# Patient Record
Sex: Female | Born: 1985 | Race: White | Hispanic: No | Marital: Married | Smoking: Current every day smoker
Health system: Southern US, Community
[De-identification: ages and names within clinical notes are randomized; demographics above are authoritative.]

## PROBLEM LIST (undated history)

## (undated) DIAGNOSIS — A630 Anogenital (venereal) warts: Secondary | ICD-10-CM

## (undated) DIAGNOSIS — N840 Polyp of corpus uteri: Secondary | ICD-10-CM

## (undated) DIAGNOSIS — F419 Anxiety disorder, unspecified: Secondary | ICD-10-CM

## (undated) DIAGNOSIS — IMO0002 Reserved for concepts with insufficient information to code with codable children: Secondary | ICD-10-CM

## (undated) DIAGNOSIS — N871 Moderate cervical dysplasia: Secondary | ICD-10-CM

## (undated) HISTORY — PX: COLPOSCOPY: SHX161

## (undated) HISTORY — DX: Moderate cervical dysplasia: N87.1

## (undated) HISTORY — DX: Anogenital (venereal) warts: A63.0

## (undated) HISTORY — DX: Anxiety disorder, unspecified: F41.9

## (undated) HISTORY — DX: Reserved for concepts with insufficient information to code with codable children: IMO0002

---
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
## 2004-01-28 ENCOUNTER — Emergency Department (HOSPITAL_COMMUNITY): Admission: EM | Admit: 2004-01-28 | Discharge: 2004-01-28 | Payer: Self-pay | Admitting: Emergency Medicine

## 2004-06-01 ENCOUNTER — Emergency Department (HOSPITAL_COMMUNITY): Admission: EM | Admit: 2004-06-01 | Discharge: 2004-06-01 | Payer: Self-pay | Admitting: Emergency Medicine

## 2004-12-07 ENCOUNTER — Emergency Department (HOSPITAL_COMMUNITY): Admission: EM | Admit: 2004-12-07 | Discharge: 2004-12-08 | Payer: Self-pay | Admitting: Emergency Medicine

## 2005-04-26 ENCOUNTER — Encounter: Admission: RE | Admit: 2005-04-26 | Discharge: 2005-04-26 | Payer: Self-pay | Admitting: Family Medicine

## 2005-05-18 ENCOUNTER — Emergency Department (HOSPITAL_COMMUNITY): Admission: EM | Admit: 2005-05-18 | Discharge: 2005-05-18 | Payer: Self-pay | Admitting: Emergency Medicine

## 2006-03-12 ENCOUNTER — Other Ambulatory Visit: Admission: RE | Admit: 2006-03-12 | Discharge: 2006-03-12 | Payer: Self-pay | Admitting: Gynecology

## 2006-03-22 DIAGNOSIS — N871 Moderate cervical dysplasia: Secondary | ICD-10-CM

## 2006-03-22 HISTORY — DX: Moderate cervical dysplasia: N87.1

## 2007-01-19 ENCOUNTER — Inpatient Hospital Stay (HOSPITAL_COMMUNITY): Admission: RE | Admit: 2007-01-19 | Discharge: 2007-01-22 | Payer: Self-pay | Admitting: Obstetrics & Gynecology

## 2008-02-18 ENCOUNTER — Ambulatory Visit: Payer: Self-pay | Admitting: Gynecology

## 2008-04-06 ENCOUNTER — Ambulatory Visit: Payer: Self-pay | Admitting: Gynecology

## 2008-04-06 ENCOUNTER — Encounter: Payer: Self-pay | Admitting: Gynecology

## 2008-04-06 ENCOUNTER — Other Ambulatory Visit: Admission: RE | Admit: 2008-04-06 | Discharge: 2008-04-06 | Payer: Self-pay | Admitting: Gynecology

## 2008-07-30 ENCOUNTER — Emergency Department (HOSPITAL_COMMUNITY): Admission: EM | Admit: 2008-07-30 | Discharge: 2008-07-30 | Payer: Self-pay | Admitting: Emergency Medicine

## 2009-02-22 ENCOUNTER — Emergency Department (HOSPITAL_COMMUNITY): Admission: EM | Admit: 2009-02-22 | Discharge: 2009-02-22 | Payer: Self-pay | Admitting: Emergency Medicine

## 2009-03-31 ENCOUNTER — Ambulatory Visit (HOSPITAL_COMMUNITY): Admission: RE | Admit: 2009-03-31 | Discharge: 2009-03-31 | Payer: Self-pay | Admitting: Family Medicine

## 2009-04-21 ENCOUNTER — Ambulatory Visit (HOSPITAL_COMMUNITY)
Admission: RE | Admit: 2009-04-21 | Discharge: 2009-04-21 | Payer: Self-pay | Source: Home / Self Care | Admitting: Family Medicine

## 2009-10-02 ENCOUNTER — Encounter: Admission: RE | Admit: 2009-10-02 | Discharge: 2009-10-02 | Payer: Self-pay | Admitting: Family Medicine

## 2010-06-05 NOTE — Op Note (Signed)
Jacqueline Stewart, VOKES          ACCOUNT NO.:  [id number]   MEDICAL RECORD NO.:  [id number]          PATIENT TYPE:  INP   LOCATION:  9199                          FACILITY:  [facility]   PHYSICIAN:  Gerrit Friends. Aldona Bar, M.D.   DATE OF BIRTH:  1985-08-14   DATE OF PROCEDURE:  01/19/2007  DATE OF DISCHARGE:                               OPERATIVE REPORT   PREOPERATIVE DIAGNOSES:  Term pregnancy, previous cesarean section.   POSTOPERATIVE DIAGNOSIS:  Term pregnancy, previous cesarean section,  plus delivery of 7 pounds 7 ounces female infant, Apgars 9 and 10.   PROCEDURE:  Repeat low transverse cesarean section.   SURGEON:  Gerrit Friends. Aldona Bar, M.D.   ASSISTANT:  Randye Lobo, M.D.   Spinal, Raul Del, M.D.   HISTORY:  This 25 year old gravida 2 para 1 had a relatively benign  pregnancy.  Due date January 25, 2007.  She is now at 75 weeks' gestation  and being taken to the operating room for a repeat low transverse  cesarean section, having had a cesarean section delivery with her first  baby.   PROCEDURE:  The patient was taken to the operating room where after  satisfactory induction of spinal anesthetic by Dr. Tacy Dura, she was  prepped and draped, having been placed in the supine position slightly  tilted to the left with a Foley catheter inserted as part of the prep.   Once the patient was adequately draped and good anesthetic levels were  documented, the procedure was begun.   A Pfannenstiel incision was made and with minimal difficulty dissected  down sharply to and through the fascia in a low transverse fashion.  Subfascial space was created inferiorly and superiorly, muscles  separated in the midline, peritoneum identified and entered  appropriately with care taken to avoid the bowel superiorly and the  bladder inferiorly.  There was are area of adhesion on the right side of  the uterus and also a small loop of omentum in this adhesion.  Nonetheless, it was possible to obtain  adequate exposure, and once the  bladder blade was place, the vesicouterine peritoneum was incised in a  low transverse fashion and pushed off the lower uterine segment with  ease.  Sharp incision into the uterus was then made with the Metzenbaum  scissors, extended laterally with fingers.  Fluid was clear and with the  aid of vacuum extractor a viable female infant weighing 7 pounds 7 ounces  delivered from a vertex position.  After the cord was clamped and cut,  the infant was passed off to the awaiting team.  Subsequent Apgars were  assigned at 9 and 10.   Cord bloods were collected and thereafter the placenta was delivered  intact.   At this time the placenta was extracted.  Uterus was exteriorized and  manually rendered free of any remaining products of conception.  Good  uterine contractility was afforded with slowly given intravenous Pitocin  and manual stimulation.  The adhesion on the right side of the uterus  which was originally felt to be the round ligament was clamped, cut and  rendered hemostatic, and the  band of omentum was freed.  At this time  closure of the uterine incision was carried out using #1 Vicryl from  angle to angle bilaterally with relatively good hemostasis.  Several  additional figure-of-eight #1 Vicryl sutures were applied for additional  hemostasis and support of the incision.   At this time the incision was noted to be dry.  The area of adhesiolysis  was stable, tubes and ovaries normal, uterus well contracted, and after  the abdomen was lavaged of all free blood and clot, the uterus was  replaced in the abdominal cavity and after all counts noted to be  correct and no foreign bodies in the remaining abdominal cavity, closure  of the abdomen was begun in layers.  The abdominal peritoneum was closed  with 0 Vicryl in a running fashion.  Muscle secured with same.  Assured  of good fascial hemostasis, the fascia was then reapproximated using 0  Vicryl from  angle to midline bilaterally.  Subcutaneous tissue was  rendered hemostatic.  The original skin incision was undermined some to  facilitate good closure of the skin which was closed with staples  thereafter.  A sterile pressure dressing was applied and the patient at  this time was transported to the recovery room in satisfactory  condition, having tolerated the procedure well..  Estimated blood loss  500 mL.  All counts were correct x2.  At the conclusion of this  procedure, both mother and baby were doing well respective in their  respective recovery areas.      Gerrit Friends. Aldona Bar, M.D.  Electronically Signed     RMW/MEDQ  D:  01/19/2007  T:  01/19/2007  Job:  [id number]

## 2010-06-08 NOTE — Discharge Summary (Signed)
NAME:  Jacqueline Stewart, POLIMENI NO.:  [id number]   MEDICAL RECORD NO.:  [id number]            PATIENT TYPE:   LOCATION:                                 FACILITY:   PHYSICIAN:  Randye Lobo, M.D.        DATE OF BIRTH:   DATE OF ADMISSION:  01/19/2007  DATE OF DISCHARGE:  01/22/2007                               DISCHARGE SUMMARY   FINAL DIAGNOSES:  Intrauterine pregnancy at term, history of previous  cesarean section, and the patient desires repeat cesarean section.   PROCEDURE:  Repeat low transverse cesarean section.  Surgeon, Annamaria Helling, M.D., assistant Dr. Conley Simmonds.   COMPLICATIONS:  None.   This 25 year old G2, P1 was admitted at term for repeat cesarean  section.  She had had a prior cesarean section with her first delivery  and desires a repeat with this pregnancy as well.  Her antepartum course  up to this point has been uncomplicated.  She was taken to the operating  room on January 19, 2007 by Dr. Annamaria Helling where a repeat low  transverse cesarean section was performed with the delivery of a 7  pounds 7 ounces female infant with Apgars of 9 and 10.  Delivery went  without complications.  The patient's postoperative course was benign  without any significant fevers.  The little boy was circumcised prior to  discharge.  The patient was felt ready for discharge on postoperative  day #3, was sent home on a regular diet, told to decrease activities,  told to continue prenatal vitamins, was given Percocet 1-2 every 4-6  hours as needed for pain, told she could use ibuprofen up to 600 mg  every 6 hours as needed for pain, was to follow up in our office in 4  weeks.  Instructions and precautions were reviewed with the patient.   LABORATORIES ON DISCHARGE:  She had a hemoglobin of 10.3, white blood  cell count of 9.4 and platelets of 180,000.      Leilani Able, P.A.-C.      Randye Lobo, M.D.  Electronically Signed    MB/MEDQ  D:  02/17/2007   T:  02/17/2007  Job:  [id number]

## 2010-06-08 NOTE — H&P (Signed)
[place]  Patient:    Jacqueline, Stewart Visit Number: [id number] MRN: [id number]          Service Type: PED Location: PEDS 6122 01 Attending Physician:  Rosanne Sack Dictated by:   Rosanne Sack, M.D. Admit Date:  01/26/2001   CC:         Dyanne Carrel, M.D. - [facility]   History and Physical  DATE OF BIRTH:  [birth year]  PROBLEMS 1. Sinus symptom complex:  Subjective fever, chills, dysuria, increased    urinary frequency, left flank pain, nausea, and vomiting.    a. Rule out acute pyelonephritis with septicemia.    b. Rule out pregnancy. 2. Dehydration. 3. Tobacco abuse, one pack per day x 2 years. 4. History of urinary tract infection in November 2002. 5. History of vaginal Candidiasis on December 31, 2000, treated with Diflucan.  CHIEF COMPLAINT:  Nausea, vomiting, and left flank pain.  HISTORY OF PRESENT ILLNESS:  [name] is a very pleasant 25 year old female, who presents with a six-day history of increased urinary frequency, dysuria, and lower pelvic pressure.  For the last three days, she also has noticed left flank tenderness, a subjective fever, and chills.  This morning she woke up having nausea and vomiting.  No diarrhea.  No vaginal discharge.  The patients last menstrual period was on December 31, 2000.  She was taking birth control pills until 1-1/2 months ago.  She is sexually active.  She has one single sexual partner.  She has been using condoms on every single sexual relation.  No hematuria.  No headaches.  No syncopal episodes.  No diarrhea. No melena, tarry stools, or bright red blood per rectum.  No cough.  No shortness of breath.  No chest pain.  The patient describes some lower abdominal pressure, but no cramping symptoms.  No vaginal bleeding.  No skin rash.  PAST MEDICAL HISTORY:  As per the problem list.  ALLERGIES:  No known drug allergies.  CURRENT  MEDICATIONS:  None.  SOCIAL HISTORY:  The patient is single.  Lives with her mother.  She smokes as described above.  She is a [place].  She has a formal boyfriend. As described in the HPI section, she is sexually active and she uses condoms for the last 1-1/2 months.  Prior to this, she was using birth control pills. No children.  She smokes once in awhile, along with some alcohol.  FAMILY HISTORY:  The patients grandmother and grandfather had diabetes mellitus.  Both grandfathers had acute myocardial infarctions in their 53s. No strokes or malignancy in the family.  Her grandmother and great-grandmother had hypertension.  REVIEW OF SYSTEMS:  As in the HPI section.  PHYSICAL EXAMINATION  VITAL SIGNS:  Temperature in the [facility] 99.7 degrees, heart rate 102, respirations 20, blood pressure 116/65.  HEENT:  Normocephalic, atraumatic.  Anicteric sclerae.  Conjunctivae within normal limits.  PERRLA.  EOMI.  Funduscopic exam negative for papilledema or hemorrhages.  Dry mucous membranes.  Tympanic membranes within normal limits. Oropharynx clear.  NECK:  Supple, no jugular venous distention, no bruits, no adenopathy, no thyromegaly.  LUNGS:  Clear to auscultation bilaterally without crackles or wheezes.  Fair air movement bilaterally.  CARDIAC:  A regular rate and rhythm without murmurs, rubs, or gallops.  Normal S1, S2.  ABDOMEN:  Flat, nontender, nondistended.  Bowel sounds present.  There is a left flank CVA tenderness.  No masses.  No  hepatosplenomegaly.  No bruits, no masses, no guarding.  GENITOURINARY:  Within normal limits.  BREASTS:  Within normal limits.  RECTAL:  Deferred.  EXTREMITIES:  No edema, no cyanosis.  Pulses 2+ bilaterally.  NEUROLOGIC:  Alert and oriented x 3.  Cranial nerves II-XII intact.  Strength 5/5 in all extremities.  Deep tendon reflexes 3/5 in all extremities.  Sensory intact.  Plantar reflexes downgoing  bilaterally.  LABORATORY DATA:  Pending.  ASSESSMENT/PLAN 1. Probable acute pyelonephritis with septicemia:  Given the onset of    urinary symptoms about six days ago, followed by progression of her    lower pelvic pain that extended into the flank, and then fever and    chills, pyelonephritis is very likely.  The patient was not able to    hold fluids or solids for the last 12 hours.  For this reason she is    being admitted to receive intravenous antibiotic therapy and supportive    care.  Given that her last menstrual period was on December 31, 2000,    and the patient is sexually active, a urinary pregnancy test will be    obtained.  A urine culture also will be obtained prior to the initiation    of intravenous antibiotic therapy.  I will hold on the antibiotics until    the results of the urinary pregnancy test are available, and with    negative results.  Will provide with supportive care with intravenous    Phenergan, Protonix, and morphine for pain control.  No blood cultures    will be obtained, as the patient is hemodynamically stable. 2. Dehydration:  The physical examination is consistent with dehydration.    Will start intravenous normal saline at 100 cc an hour.  Will follow up    the patients electrolytes.  Will supplement with potassium as needed.    Also will follow this problem clinically. 3. Tobacco abuse:  I spent about 15 minutes talking about smoking cessation.    The patient has been smoking for the last two years, a pack a day.  I    stressed the importance of smoking cessation.  The patient declined a    Nicotine Patch to help with withdrawal symptoms. 4. General assessment:  I spoke to the patient about sexual abstinence,    as well as protective sex. Dictated by:   Rosanne Sack, M.D. Attending Physician:  Rosanne Sack DD:  01/26/01 TD:  01/26/01 Job: [id number]

## 2010-06-08 NOTE — Discharge Summary (Signed)
NAME:  Jacqueline Stewart, Jacqueline Stewart                        ACCOUNT NO.:  [id number]   MEDICAL RECORD NO.:  [id number]                   PATIENT TYPE:  INP   LOCATION:  9132                                 FACILITY:  [facility]   PHYSICIAN:  Ivor Costa. Farrel Gobble, M.D.              DATE OF BIRTH:  11/16/1985   DATE OF ADMISSION:  07/18/2003  DATE OF DISCHARGE:                                 DISCHARGE SUMMARY   PRINCIPAL COMPLAINT:  Term pregnancy.   PRINCIPAL PROCEDURE:  Primary cesarean section.   HISTORY OF PRESENT ILLNESS:  The patient is an 25 year old G1 at 40+ weeks  who presented to labor and delivery in active labor on the morning of July 18, 2003.  The patient rapidly progressed to 6 cm; however, arrested at that  point for approximately 6 hours.  An IUPC had been placed, Pitocin  augmentation had been begun, and despite what were felt to be adequate  contractions and usual maneuvers in order to turn the baby, the patient  failed to make any further change in dilation or descent and therefore  underwent a primary cesarean section for suspected CPD on the evening of  June 27.  The C-section was uneventful, estimated blood loss was 600.  It  was delivery of a viable female, 6 pounds, with Apgars of 8 and 9 and  spontaneous cry.  The patient was then transferred to the postpartum floor.  Her postpartum course was unremarkable.  At the time of discharge the  patient was breastfeeding, she had received a breastfeeding consultation.  She was ambulating without difficulty, she was controlled with her oral pain  medicines of Motrin and Tylox, and she was discharged after her staples were  removed.   LABORATORY DATA:  Her postpartum white count was 12, hemoglobin was 11.6,  hematocrit was 33.7, and platelets were 180.   DISCHARGE CONDITION:  Stable.   She was instructed to follow up in the office 6 weeks postpartum.                                               Ivor Costa. Farrel Gobble, M.D.    Jacqueline Stewart   D:  07/21/2003  T:  07/21/2003  Job:  [id number]

## 2010-06-08 NOTE — Discharge Summary (Signed)
[place]. [facility]  Patient:    Jacqueline Stewart, Jacqueline Stewart Visit Number: [id number] MRN: [id number]          Service Type: PED Location: PEDS 6122 01 Attending Physician:  Rosanne Sack Dictated by:   Renford Dills, M.D. Admit Date:  01/26/2001 Discharge Date: 02/01/2001   CC:         Jacqueline Stewart, M.D., [facility]   Discharge Summary  DISCHARGE DIAGNOSES: 1. Pyelonephritis. 2. Presumed pelvic inflammatory disease (PID). 3. Dehydration. 4. Hypokalemia, resolved. 5. Bilateral ovarian cysts.  DISCHARGE MEDICATIONS: 1. Tequin 400 mg 1 daily. 2. Flagyl 500 mg 1 every 12 hours. 3. Indocid 1 every 8-12 hours only as needed for severe pain. 4. Motrin 800 mg 1 every 12 hours.  FOLLOWUP:  The patient will be asked to follow up with her primary care physician, Dr. Gaylan Stewart, in approximately one week for followup exam and followup blood work to check renal function and CBC.  The patient is asked to drink at least 1-1/2 liters of water per day and to refrain from any alcohol use while on above antibiotics.  STUDIES:  The patient had a UA C&S which grew out Jacqueline Stewart greater than 100,000 colonies, pan sensitive.  The patient had a CT scan of the abdomen and pelvis which was significant for no ureteral stone, no renal stone.  It did show enlargement, mildly, of the left kidney.  It also showed bilateral ovarian cysts and fluid in the pelvis.  It also shows some mild edema associated with inferior left perirenal tissue, extending to the region of the left pericolic gutter, and as stated, no stone and definitely no hydronephrosis associated with either kidney.  LABORATORIES:  The patient had a CBC and BMET ordered serially.  Admission white count 25,000, discharge white count 7.  The patient intermittently had hypokalemia during this admission, no complications, resolved with repletion with a discharge potassium of 3.8.  Please see  admission H&P for further details on history of present illness.  HOSPITAL COURSE: #1 - PYELONEPHRITIS/PRESUMED PID:  This is a 25 year old white female who was admitted to the medicine service after having a six-day history of increased urinary frequency, dysuria, and pelvic pressure, as well as left flank tenderness, subjective fever and chills.  The morning of admission, the patient had awakened with nausea and vomiting.  She denied any vaginal discharge.  The patient was seen by her primary medical doctor and sent to the hospital for evaluation.  Laboratories, as described, revealed elevated white blood cell count, fever, and abnormal UA.  Admitting diagnosis was pyelonephritis.  The patient was started on empiric antibiotics in the form of Tequin.  The patients course was somewhat tenuous with continued fever and significant abdominal discomfort which the patient described as being colicky. This symptoms complex was followed up by CT scan.  CT scan did not show any stone, however, findings as described previously, left kidney somewhat larger than the right, associated edema, bilateral ovarian cysts, and fluid in the pelvis.  The differential diagnoses included fluid from the ruptured cyst versus PID.  Because of the patients history of STDs, i.e., yeast infection, and being sexually active, and with pelvic pressure, it was elected to treat the patient empirically with PID as well, therefore, Flagyl was added.  Over the next 24-48 hours, the patients course improved dramatically with defervescence of the fever, decrease in symptoms of pain, resolution of elevated white blood cell count.  The patient was continued on the  current treatment until discharge.  At the time of discharge, the patient is afebrile with normal white count and normal renal function.  The patient is being discharged to home.  She will continue a total of two weeks off of antibiotics in the form of Flagyl  and Tequin.  She was asked to follow up with her outpatient primary care physician in approximately one week for followup laboratories.  #2 - HYPOKALEMIA:  Resolved, post repletion.  #3 - DEHYDRATION:  Resolved, post receiving IV fluids.  #4 - TOBACCO ABUSE:  The patient is asked to refrain from tobacco use. Dictated by:   Renford Dills, M.D. Attending Physician:  Rosanne Sack DD:  02/01/01 TD:  02/02/01 Job: [id number]

## 2010-06-08 NOTE — Op Note (Signed)
NAME:  Jacqueline Stewart, Jacqueline Stewart                        ACCOUNT NO.:  [id number]   MEDICAL RECORD NO.:  [id number]                   PATIENT TYPE:  INP   LOCATION:  9132                                 FACILITY:  [facility]   PHYSICIAN:  Ivor Costa. Farrel Gobble, M.D.              DATE OF BIRTH:  Jan 10, 1986   DATE OF PROCEDURE:  07/18/2003  DATE OF DISCHARGE:                                 OPERATIVE REPORT   PREOPERATIVE DIAGNOSES:  1. Forty-week gestation.  2. Cephalopelvic disproportion.   POSTOPERATIVE DIAGNOSES:  1. Forty-week gestation.  2. Cephalopelvic disproportion.   PROCEDURE:  Primary cesarean section, low flap transverse.   SURGEON:  Ivor Costa. Farrel Gobble, M.D.   ANESTHESIA:  Spinal.   ESTIMATED BLOOD LOSS:  600 mL.   URINE OUTPUT:  Greater than 75 mL of clear urine.   FINDINGS:  A viable female infant in OP presentation, loose nuchal cord.  Clear amniotic fluid.  Birth weight was 6 pounds, Apgars 8 and 9, normal  uterus, tubes, and ovaries.   COMPLICATIONS:  None.   PATHOLOGY:  None.   PROCEDURE:  The patient was taken to the operating room, spinal anesthesia  was induced, and placed in a supine position with left lateral displacement,  prepped and draped in the usual sterile fashion.  After adequate anesthesia  was assured, a Pfannenstiel skin incision was made with a scalpel and  carried through to the underlying layer of fascia with electrocautery.  The  fascia was scored in the midline.  The incision was extended laterally with  the Bovie.  The inferior aspect of the fascial incision was grasped with  Kochers.  Underlying rectus muscles were dissected off by blunt and sharp  dissection.  In a similar fashion the inferior aspect of the incision was  grasped with Kochers and the underlying rectus muscles were dissected off.  The rectus muscles were separated in the midline.  The peritoneum was  entered bluntly.  The peritoneal incision was then extended superiorly and  inferiorly  with good visualization of the underlying bowel and bladder.  The  __________ used to confirm the bladder blade was inserted.  The  vesicouterine peritoneum was identified and entered sharply with the  Metzenbaums.  The incision was extended laterally.  The bladder flap was  created digitally.  The bladder blade was then reinserted and the lower  uterine segment was incised in transverse fashion with the scalpel.  Once we  entered the cavity, it was clear that the infant was in the OP presentation  as he bit the examining finger placed in the incision, which was extended  bluntly.  The infant was then delivered from the vertex presentation.  The  cord was reduced and the cord was clamped and cut, and then handed off to  the waiting pediatricians.  Cord bloods were obtained as well as cord blood  for paternity testing.  The uterus was massaged.  The placenta was allowed  to separate naturally.  The uterus was then cleared of all clots and debris.  The uterine incision was repaired with a running locked layer of 0 chromic  and a small area of bleeding inferior to the incision was treated with a  figure-of-eight and noted to be hemostatic.  The pelvis was then irrigated  with copious amounts of warm saline.  The uterus, tubes, and ovaries were  inspected and noted to be unremarkable.  The peritoneum, bladder flap, and  muscle were also noted to be hemostatic.  The fascia was then closed with 0  Vicryl in a running fashion, the subcu was irrigated, the subcu was  reapproximated with interrupted 2-0 plain, and the skin was reapproximated  with staples.  The patient tolerated the procedure well.  Sponge, lap, and  needle counts were correct x2 and she was transferred to the PACU in stable  condition.                                               Ivor Costa. Farrel Gobble, M.D.    Leda Roys  D:  07/18/2003  T:  07/19/2003  Job:  [id number]

## 2010-10-26 LAB — CBC
HCT: 38.9
Hemoglobin: 10.3 — ABNORMAL LOW
MCHC: 34.7
MCHC: 35
RBC: 4.14
RDW: 13.3
WBC: 8.9

## 2010-10-26 LAB — RPR: RPR Ser Ql: NONREACTIVE

## 2011-01-22 DIAGNOSIS — IMO0002 Reserved for concepts with insufficient information to code with codable children: Secondary | ICD-10-CM

## 2011-01-22 DIAGNOSIS — A630 Anogenital (venereal) warts: Secondary | ICD-10-CM

## 2011-01-22 HISTORY — DX: Anogenital (venereal) warts: A63.0

## 2011-01-22 HISTORY — DX: Reserved for concepts with insufficient information to code with codable children: IMO0002

## 2011-01-31 ENCOUNTER — Encounter (HOSPITAL_COMMUNITY): Payer: Self-pay

## 2011-01-31 ENCOUNTER — Emergency Department (HOSPITAL_COMMUNITY): Payer: BC Managed Care – PPO

## 2011-01-31 ENCOUNTER — Emergency Department (HOSPITAL_COMMUNITY)
Admission: EM | Admit: 2011-01-31 | Discharge: 2011-01-31 | Disposition: A | Discharge status: Home / Self Care | Payer: BC Managed Care – PPO | Attending: Emergency Medicine | Admitting: Emergency Medicine

## 2011-01-31 DIAGNOSIS — M549 Dorsalgia, unspecified: Secondary | ICD-10-CM | POA: Insufficient documentation

## 2011-01-31 DIAGNOSIS — R110 Nausea: Secondary | ICD-10-CM | POA: Insufficient documentation

## 2011-01-31 DIAGNOSIS — R109 Unspecified abdominal pain: Secondary | ICD-10-CM | POA: Insufficient documentation

## 2011-01-31 LAB — COMPREHENSIVE METABOLIC PANEL
Chloride: 109 mEq/L (ref 96–112)
Creatinine, Ser: 0.87 mg/dL (ref 0.50–1.10)
GFR calc Af Amer: >90 mL/min (ref >90)
GFR calc non Af Amer: >90 mL/min (ref >90)
Glucose, Bld: 76 mg/dL (ref 70–99)
Potassium: 3.8 mEq/L (ref 3.5–5.1)
Sodium: 139 mEq/L (ref 135–145)
Total Bilirubin: 0.2 mg/dL — ABNORMAL LOW (ref 0.3–1.2)
Total Protein: 7.7 g/dL (ref 6.0–8.3)

## 2011-01-31 LAB — URINALYSIS, ROUTINE W REFLEX MICROSCOPIC
Nitrite: NEGATIVE
Protein, ur: NEGATIVE mg/dL
Urobilinogen, UA: 0.2 mg/dL (ref 0.0–1.0)

## 2011-01-31 LAB — WET PREP, GENITAL
Trich, Wet Prep: NONE SEEN
Yeast Wet Prep HPF POC: NONE SEEN

## 2011-01-31 LAB — CBC
Hemoglobin: 14.3 g/dL (ref 12.0–15.0)
MCH: 31 pg (ref 26.0–34.0)
WBC: 11.6 10*3/uL — ABNORMAL HIGH (ref 4.0–10.5)

## 2011-01-31 LAB — POCT PREGNANCY, URINE: Preg Test, Ur: NEGATIVE

## 2011-01-31 LAB — LIPASE, BLOOD: Lipase: 19 U/L (ref 11–59)

## 2011-01-31 MED ORDER — SODIUM CHLORIDE 0.9 % IV SOLN: INTRAVENOUS | Status: DC

## 2011-01-31 MED ORDER — CEFTRIAXONE SODIUM 250 MG IJ SOLR
250.0000 mg | Freq: Once | INTRAMUSCULAR | Status: AC
  Administered 2011-01-31: 250 mg via INTRAMUSCULAR
  Filled 2011-01-31: qty 250

## 2011-01-31 MED ORDER — IOHEXOL 300 MG/ML  SOLN
100.0000 mL | Freq: Once | INTRAMUSCULAR | Status: AC | PRN
  Administered 2011-01-31: 100 mL via INTRAVENOUS

## 2011-01-31 MED ORDER — HYDROMORPHONE HCL PF 1 MG/ML IJ SOLN
1.0000 mg | Freq: Once | INTRAMUSCULAR | Status: AC
  Administered 2011-01-31: 1 mg via INTRAVENOUS
  Filled 2011-01-31: qty 1

## 2011-01-31 MED ORDER — ONDANSETRON 8 MG PO TBDP: 8.0000 mg | ORAL_TABLET | Freq: Three times a day (TID) | ORAL | Status: AC | PRN

## 2011-01-31 MED ORDER — AZITHROMYCIN 250 MG PO TABS
1000.0000 mg | ORAL_TABLET | Freq: Once | ORAL | Status: AC
  Administered 2011-01-31: 1000 mg via ORAL
  Filled 2011-01-31: qty 4

## 2011-01-31 MED ORDER — SODIUM CHLORIDE 0.9 % IV BOLUS (SEPSIS)
500.0000 mL | Freq: Once | INTRAVENOUS | Status: AC
  Administered 2011-01-31: 500 mL via INTRAVENOUS

## 2011-01-31 MED ORDER — ONDANSETRON HCL 4 MG/2ML IJ SOLN
4.0000 mg | Freq: Once | INTRAMUSCULAR | Status: AC
  Administered 2011-01-31: 4 mg via INTRAVENOUS
  Filled 2011-01-31: qty 2

## 2011-01-31 MED ORDER — HYDROCODONE-ACETAMINOPHEN 5-325 MG PO TABS: 1.0000 | ORAL_TABLET | ORAL | Status: AC | PRN

## 2011-01-31 MED ORDER — MORPHINE SULFATE 4 MG/ML IJ SOLN
4.0000 mg | Freq: Once | INTRAMUSCULAR | Status: AC
  Administered 2011-01-31: 4 mg via INTRAVENOUS
  Filled 2011-01-31: qty 1

## 2011-01-31 MED ORDER — LIDOCAINE HCL (PF) 1 % IJ SOLN
INTRAMUSCULAR | Status: AC
  Administered 2011-01-31: 5 mL
  Filled 2011-01-31: qty 5

## 2011-01-31 NOTE — ED Provider Notes (Signed)
History     CSN: [id number]  Arrival date & time 01/31/11  1321   First MD Initiated Contact with Patient 01/31/11 1607      Chief Complaint  Patient presents with  . Abdominal Pain    (Consider location/radiation/quality/duration/timing/severity/associated sxs/prior treatment) Patient is a 26 y.o. female presenting with abdominal pain. The history is provided by the patient.  Abdominal Pain The primary symptoms of the illness include abdominal pain and nausea. The primary symptoms of the illness do not include fever, shortness of breath, vomiting, diarrhea, hematochezia, dysuria, vaginal discharge or vaginal bleeding. The current episode started 6 to 12 hours ago. The onset of the illness was sudden. The problem has been gradually improving.  The pain came on suddenly. The abdominal pain has been gradually improving since its onset. The abdominal pain is located in the RLQ and LLQ. The abdominal pain radiates to the back. The severity of the abdominal pain is 8/10. The abdominal pain is relieved by nothing.  The patient states that she believes she is currently not pregnant. The patient has not had a change in bowel habit. Additional symptoms associated with the illness include back pain. Symptoms associated with the illness do not include urgency, hematuria or frequency. Associated medical issues comments: History of constipation.    Past Medical History  Diagnosis Date  . Constipation     Past Surgical History  Procedure Date  . Cesarean section     History reviewed. No pertinent family history.  History  Substance Use Topics  . Smoking status: Current Everyday Smoker  . Smokeless tobacco: Not on file  . Alcohol Use: Yes    OB History    Grav Para Term Preterm Abortions TAB SAB Ect Mult Living                  Review of Systems  Constitutional: Negative for fever.  HENT: Negative for congestion and neck pain.   Eyes: Negative for pain and visual disturbance.    Respiratory: Negative for shortness of breath.   Gastrointestinal: Positive for nausea and abdominal pain. Negative for vomiting, diarrhea and hematochezia.  Genitourinary: Negative for dysuria, urgency, frequency, hematuria, vaginal bleeding and vaginal discharge.  Musculoskeletal: Positive for back pain.  Skin: Negative for rash.  Neurological: Negative for syncope and headaches.  Hematological: Does not bruise/bleed easily.    Allergies  Review of patient's allergies indicates no known allergies.  Home Medications   Current Outpatient Rx  Name Route Sig Dispense Refill  . RIZATRIPTAN BENZOATE 10 MG PO TBDP Oral Take 10 mg by mouth as needed. May repeat in 2 hours if needed. As needed for migraines.    . TOPIRAMATE 100 MG PO TABS Oral Take 100 mg by mouth at bedtime.      BP 101/64  Pulse 74  Temp(Src) 98.2 F (36.8 C) (Oral)  Resp 18  SpO2 99%  LMP 01/25/2011  Physical Exam  Constitutional: She is oriented to person, place, and time. She appears well-developed and well-nourished. No distress.  HENT:  Head: Normocephalic and atraumatic.  Mouth/Throat: Oropharynx is clear and moist.  Eyes: Conjunctivae and EOM are normal. Pupils are equal, round, and reactive to light.  Neck: Normal range of motion. Neck supple.  Cardiovascular: Normal rate, regular rhythm and normal heart sounds.   No murmur heard. Pulmonary/Chest: Effort normal and breath sounds normal. No respiratory distress.  Abdominal: Soft. Bowel sounds are normal. There is tenderness. There is guarding.  Musculoskeletal: Normal range of motion.  Neurological: She is alert and oriented to person, place, and time. No cranial nerve deficit. She exhibits normal muscle tone. Coordination normal.  Skin: Skin is warm. No rash noted.    ED Course  Procedures (including critical care time)   Labs Reviewed  URINALYSIS, ROUTINE W REFLEX MICROSCOPIC  POCT PREGNANCY, URINE  POCT PREGNANCY, URINE  COMPREHENSIVE  METABOLIC PANEL  LIPASE, BLOOD  CBC   No results found.   1. Abdominal pain       MDM   Patient with acute onset of bilateral lower quadrant abdominal pain 11:00 this morning associated with some nausea no vomiting pain is improved a little bit that has moved to a generalized abdominal pain and also radiates to the back on examination she has tenderness mostly in the left upper quadrant left lower quadrant part of her abdomen to see test is negative urinalysis is normal CBC complete metabolic panel lipase are pending CT scan of abdomen and pelvis with contrast has been ordered to further evaluate this.  We'll move patient to CDU pending CT scan results.  Medical screening examination/treatment/procedure(s) were conducted as a shared visit with non-physician practitioner(s) and myself.  I personally evaluated the patient during the encounter        Shelda Jakes, MD 01/31/11 [phone]

## 2011-01-31 NOTE — ED Notes (Signed)
Ct aware pt has finished her prep. Spoke with [employer]

## 2011-01-31 NOTE — ED Provider Notes (Signed)
Pt presented to ED w/ c/o abdominal pain and nausea that started acutely late this morning.   Moved to CDU for labs and CT abd/pelvis and all are unremarkable.  On re-examination, afebrile, NAD, diffuse abd ttp but worst in LUQ and across lower abd.  She has had increased and and abnormally colored vaginal discharge recently but otherwise no GU sx.  Will perform pelvic exam.  Pt requests another dose of pain medication.  8:11 PM   Pelvic exam:  Nml external genitalia.  Small amt of clear vaginal discharge, no bleeding.  Cervix closed and appears nml.  No adnexal or cervical motion tenderess.  Pt reports that pain has improved w/ morphine.  Wet prep and GC/Chlam pending.  8:53 PM   Wet prep pos for tntc WBCs.  Pt treated w/ azithromycin and rocephin in ED.  PID unlikely based on exam.  Tolerating pos.  D/c'd home w/ pain and nausea medication.  Recommended f/u with either PCP or GYN.  Return precautions discussed.   Otilio Miu, [place] 02/01/11 [phone]

## 2011-01-31 NOTE — ED Notes (Signed)
Pt states that she had sudden onset of lower abdominal pain that she describes as sharp pressure in her lower abdomen. She states that the pain is constant where she feels like pressure is in her bladder when she goes to the bathroom. She just finished her period.

## 2011-02-01 LAB — GC/CHLAMYDIA PROBE AMP, GENITAL: GC Probe Amp, Genital: NEGATIVE

## 2011-02-03 NOTE — ED Provider Notes (Signed)
Medical screening examination/treatment/procedure(s) were conducted as a shared visit with non-physician practitioner(s) and myself.  I personally evaluated the patient during the encounter    Shelda Jakes, MD 02/03/11 [phone]

## 2011-02-04 ENCOUNTER — Encounter: Payer: Self-pay | Admitting: *Deleted

## 2011-02-04 ENCOUNTER — Encounter: Payer: Self-pay | Admitting: Gynecology

## 2011-02-04 ENCOUNTER — Ambulatory Visit (INDEPENDENT_AMBULATORY_CARE_PROVIDER_SITE_OTHER): Payer: BC Managed Care – PPO | Admitting: Gynecology

## 2011-02-04 DIAGNOSIS — N926 Irregular menstruation, unspecified: Secondary | ICD-10-CM

## 2011-02-04 DIAGNOSIS — N760 Acute vaginitis: Secondary | ICD-10-CM

## 2011-02-04 DIAGNOSIS — N898 Other specified noninflammatory disorders of vagina: Secondary | ICD-10-CM

## 2011-02-04 DIAGNOSIS — N871 Moderate cervical dysplasia: Secondary | ICD-10-CM | POA: Insufficient documentation

## 2011-02-04 DIAGNOSIS — IMO0002 Reserved for concepts with insufficient information to code with codable children: Secondary | ICD-10-CM | POA: Insufficient documentation

## 2011-02-04 DIAGNOSIS — A499 Bacterial infection, unspecified: Secondary | ICD-10-CM

## 2011-02-04 DIAGNOSIS — R109 Unspecified abdominal pain: Secondary | ICD-10-CM

## 2011-02-04 LAB — WET PREP FOR TRICH, YEAST, CLUE
Trich, Wet Prep: NEGATIVE
Yeast Wet Prep HPF POC: NEGATIVE

## 2011-02-04 LAB — TSH: TSH: 0.971 u[IU]/mL (ref 0.350–4.500)

## 2011-02-04 MED ORDER — METRONIDAZOLE 500 MG PO TABS: 500.0000 mg | ORAL_TABLET | Freq: Two times a day (BID) | ORAL | Status: AC

## 2011-02-04 NOTE — Patient Instructions (Signed)
Follow up for annual exam and hormone blood work. Call if pain persists or recurs.

## 2011-02-04 NOTE — Progress Notes (Signed)
Patient presents having been seen in the emergency room for pelvic pain, acute onset 01/31/2011. Also has a vaginal discharge.She was doing well just finished her period with the onset of this pain which quickly worsened throughout the day. No fevers chills nausea vomiting diarrhea constipation. Menses are somewhat irregular which is having a period every other month. Using coitus interruptus for birth control. Emergency room evaluation included a negative GC and Chlamydia. A negative wet prep. Negative hCG. A slight elevated white count of 11.6, hemoglobin 14. Negative urinalysis and a negative abdominopelvic CT scan. She notes her pain has improved since then and now just has some nagging discomfort.  Exam with Sherri chaperone present Spine straight without CVA tenderness. Abdomen soft nontender without masses guarding rebound organomegaly with active bowel sounds throughout. Pelvic external BUS vagina with white discharge. Cervix normal. Uterus normal size midline mobile nontender. Adnexa without masses or tenderness  Assessment and plan: 1. White discharge. KOH wet preps positive for BV. We'll treat with Flagyl 500 twice a day x7 days, alcohol avoidance reviewed. 2. Abdominal pain. I suspect she had an ovarian cyst that has ruptured. With a negative CT I do not think any further studies are needed at this time. If her pain would recur she is to call me and I'll pursue a more involved evaluation to include starting with a GYN ultrasound. 3. Irregular menses. We'll check baseline labs to include TSH, prolactin, FSH. Options for management were reviewed to include observation versus regulations such as low-dose oral contraceptives or intermittent progesterone withdrawal.  The need to have more regular menses at least every other month was discussed. 4. History dysplasia. Patient's way overdue for her annual exam she knows to schedule this and follow up with me and we'll plan Pap smear at that  time. 5. Contraception. Husbands planning on vasectomy. Need for better contraception and failure risks with coitus interruptus was discussed. 6.

## 2011-02-18 ENCOUNTER — Encounter: Payer: Self-pay | Admitting: Gynecology

## 2011-02-18 ENCOUNTER — Ambulatory Visit (INDEPENDENT_AMBULATORY_CARE_PROVIDER_SITE_OTHER): Payer: BC Managed Care – PPO | Admitting: Gynecology

## 2011-02-18 ENCOUNTER — Other Ambulatory Visit (HOSPITAL_COMMUNITY)
Admission: RE | Admit: 2011-02-18 | Discharge: 2011-02-18 | Disposition: A | Discharge status: Home / Self Care | Payer: BC Managed Care – PPO | Source: Ambulatory Visit | Attending: Gynecology | Admitting: Gynecology

## 2011-02-18 ENCOUNTER — Other Ambulatory Visit: Payer: Self-pay | Admitting: Gynecology

## 2011-02-18 VITALS — BP 120/76 | Ht 63.5 in | Wt 126.0 lb

## 2011-02-18 DIAGNOSIS — R87613 High grade squamous intraepithelial lesion on cytologic smear of cervix (HGSIL): Secondary | ICD-10-CM

## 2011-02-18 DIAGNOSIS — N898 Other specified noninflammatory disorders of vagina: Secondary | ICD-10-CM

## 2011-02-18 DIAGNOSIS — Z131 Encounter for screening for diabetes mellitus: Secondary | ICD-10-CM

## 2011-02-18 DIAGNOSIS — Z01419 Encounter for gynecological examination (general) (routine) without abnormal findings: Secondary | ICD-10-CM

## 2011-02-18 DIAGNOSIS — G43909 Migraine, unspecified, not intractable, without status migrainosus: Secondary | ICD-10-CM | POA: Insufficient documentation

## 2011-02-18 DIAGNOSIS — Z13220 Encounter for screening for lipoid disorders: Secondary | ICD-10-CM

## 2011-02-18 DIAGNOSIS — N9089 Other specified noninflammatory disorders of vulva and perineum: Secondary | ICD-10-CM

## 2011-02-18 LAB — CBC WITH DIFFERENTIAL/PLATELET
Eosinophils Absolute: 0.1 10*3/uL (ref 0.0–0.7)
Eosinophils Relative: 2 % (ref 0–5)
HCT: 42.4 % (ref 36.0–46.0)
Lymphocytes Relative: 41 % (ref 12–46)
Lymphs Abs: 2.1 10*3/uL (ref 0.7–4.0)
MCH: 30.1 pg (ref 26.0–34.0)
MCV: 89.3 fL (ref 78.0–100.0)
Monocytes Absolute: 0.3 10*3/uL (ref 0.1–1.0)
Monocytes Relative: 6 % (ref 3–12)
Platelets: 269 10*3/uL (ref 150–400)
RBC: 4.75 MIL/uL (ref 3.87–5.11)
WBC: 5.1 10*3/uL (ref 4.0–10.5)

## 2011-02-18 LAB — LIPID PANEL
HDL: 55 mg/dL (ref >39)
LDL Cholesterol: 113 mg/dL — ABNORMAL HIGH (ref 0–99)
Total CHOL/HDL Ratio: 3.4 Ratio
Triglycerides: 99 mg/dL (ref <150)
VLDL: 20 mg/dL (ref 0–40)

## 2011-02-18 LAB — URINALYSIS W MICROSCOPIC + REFLEX CULTURE
Hgb urine dipstick: NEGATIVE
Ketones, ur: NEGATIVE mg/dL
Leukocytes, UA: NEGATIVE
Nitrite: NEGATIVE
Protein, ur: NEGATIVE mg/dL
pH: 6.5 (ref 5.0–8.0)

## 2011-02-18 LAB — WET PREP FOR TRICH, YEAST, CLUE

## 2011-02-18 MED ORDER — CLINDAMYCIN PHOSPHATE 2 % VA CREA: 1.0000 | TOPICAL_CREAM | Freq: Every day | VAGINAL | Status: AC

## 2011-02-18 NOTE — Progress Notes (Signed)
Jacqueline Stewart 1985-03-28 [id number]        26 y.o.  for annual exam.  Several issues as noted below  Past medical history,surgical history, medications, allergies, family history and social history were all reviewed and documented in the EPIC chart. ROS:  Was performed and pertinent positives and negatives are included in the history.  Exam: Kim chaperone present Filed Vitals:   02/18/11 1044  BP: 120/76   General appearance  Normal Skin grossly normal Head/Neck normal with no cervical or supraclavicular adenopathy thyroid normal Lungs  clear Cardiac RR, without RMG Abdominal  soft, nontender, without masses, organomegaly or hernia Breasts  examined lying and sitting without masses, retractions, discharge or axillary adenopathy. Pelvic  Ext/BUS/vagina  normal with slight white discharge  Cervix  normal  Pap done  Uterus  anteverted, normal size, shape and contour, midline and mobile nontender   Adnexa  Without masses or tenderness    Anus and perineum  Small papilloma skin tag 9:00 position perianal   Rectovaginal  normal sphincter tone without palpated masses or tenderness.    Assessment/Plan:  26 y.o. female for annual exam.    1. Patient complaining of small bump on her perineum and on exam she has a small papilloma 9:00 position perianal. Looks like small condyloma. Patient wants removed and subsequently the area was cleansed with Betadine, infiltrated with 1% lidocaine and the skin lesion was removed in its entirety and sent to pathology. Silver nitrate applied afterwards.. Patient will follow up for pathology results. 2. 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 She has one normal Pap smear 2010. Pap smear done today. Assuming negative recommended repeat in 6 months to have several consecutive normals before going back to annual cytology and she agrees with the recommendation. 3. Persistent vaginal discharge. She  recently was treated with Flagyl 500 g twice a day x7 days for BV. She says is much improved but still has a lingering white discharge. KOH wet prep is positive for BV. We'll treat with Cleocin vaginal cream nightly times a week follow up if symptoms persist or recur. 4. Birth control. Husband is planning vasectomy. She'll let me know if he does not and we'll discuss contraceptive options. 5. History irregular periods. She will keep menstrual calendar. Recent hormone studies to include prolactin, TSH and FSH were normal. Assuming regular menses follow if irregularity continues she'll represent for evaluation. 6. Health maintenance. SBE monthly reviewed. Baseline CBC glucose lipid profile and urinalysis were ordered.    Dara Lords MD, 11:35 AM 02/18/2011

## 2011-02-18 NOTE — Patient Instructions (Signed)
Office will call you with the biopsy results. Follow up for Pap smear in 6 months

## 2011-02-19 ENCOUNTER — Encounter: Payer: Self-pay | Admitting: Gynecology

## 2011-03-11 ENCOUNTER — Ambulatory Visit (INDEPENDENT_AMBULATORY_CARE_PROVIDER_SITE_OTHER): Payer: BC Managed Care – PPO | Admitting: Gynecology

## 2011-03-11 ENCOUNTER — Encounter: Payer: Self-pay | Admitting: Gynecology

## 2011-03-11 DIAGNOSIS — N898 Other specified noninflammatory disorders of vagina: Secondary | ICD-10-CM

## 2011-03-11 DIAGNOSIS — L293 Anogenital pruritus, unspecified: Secondary | ICD-10-CM

## 2011-03-11 DIAGNOSIS — IMO0002 Reserved for concepts with insufficient information to code with codable children: Secondary | ICD-10-CM

## 2011-03-11 DIAGNOSIS — R87612 Low grade squamous intraepithelial lesion on cytologic smear of cervix (LGSIL): Secondary | ICD-10-CM

## 2011-03-11 LAB — WET PREP FOR TRICH, YEAST, CLUE

## 2011-03-11 NOTE — Progress Notes (Signed)
Patient ID: Jacqueline Stewart, female   DOB: Nov 18, 1985, 26 y.o.   MRN: [id number] Patient presents for colposcopy. Recent Pap smear showed low-grade SIL. She does have a history of moderate dysplasia in 2008. She also notes vaginal irritation following the use of her Cleocin vaginal cream she tried OTC Monistat and intense reaction with irritation over the weekend but this seems to be clearing now.  Exam was Sherrilyn Rist chaperone present Pelvic external BUS vagina with slight white discharge. Cervix grossly normal. Bimanual without masses or tenderness.  Colposcopy adequate with acetic acid cleanse. Small area of acetowhite change 12:00 transformation zone. Representative biopsy taken.   Physical Exam  Genitourinary:        Assessment and plan:  #1 Vaginal irritation. Wet prep is negative. I think she had an allergic/chemical reaction to the vaginal cream. Have recommended just observation follow up if symptoms persist or recur. #2  Low grade SIL Pap smear. Area of acetowhite at 12:00. Biopsy taken. If normal or low-grade plan expected management with every six-month Pap smear. I reviewed the issues of dysplasia with her to include high-grade low-grade lesions, HBV Association, progression or regression and the need for close follow up.  Patient will follow up for results.

## 2011-03-11 NOTE — Patient Instructions (Signed)
Follow up for biopsy results. If normal or low grade and plan every six-month Pap smears. It's very important for you to make sure you follow up at a 6 month interval. If more significant atypia then we will discuss treatment options. Follow up if vaginal irritation recurs.

## 2011-03-13 ENCOUNTER — Encounter: Payer: Self-pay | Admitting: Gynecology

## 2011-07-22 ENCOUNTER — Encounter (HOSPITAL_COMMUNITY): Payer: Self-pay | Admitting: *Deleted

## 2011-07-22 ENCOUNTER — Emergency Department (HOSPITAL_COMMUNITY)
Admission: EM | Admit: 2011-07-22 | Discharge: 2011-07-22 | Disposition: A | Discharge status: Home / Self Care | Payer: BC Managed Care – PPO | Attending: Emergency Medicine | Admitting: Emergency Medicine

## 2011-07-22 DIAGNOSIS — W57XXXA Bitten or stung by nonvenomous insect and other nonvenomous arthropods, initial encounter: Secondary | ICD-10-CM | POA: Insufficient documentation

## 2011-07-22 DIAGNOSIS — T148 Other injury of unspecified body region: Secondary | ICD-10-CM | POA: Insufficient documentation

## 2011-07-22 MED ORDER — CEPHALEXIN 500 MG PO CAPS: ORAL_CAPSULE | ORAL | Status: DC

## 2011-07-22 NOTE — Discharge Instructions (Signed)
Please soak the left thigh in a tub of warm water approximately 15 minutes for the next 5 days. Keflex 4 times daily with food. Please return if any unusual rash, fever, or complications.Wood Tick Bite Ticks are insects that attach themselves to the skin. Most tick bites are harmless, but sometimes ticks carry diseases that can make a person quite ill. The chance of getting ill depends on:  The kind of tick that bites you.   Time of year.   How long the tick is attached.   Geographic location.  Wood ticks are also called dog ticks. They are generally black. They can have white markings. They live in shrubs and grassy areas. They are larger than deer ticks. Wood ticks are about the size of a watermelon seed. They have a hard body. The most common places for ticks to attach themselves are the scalp, neck, armpits, waist, and groin. Wood ticks may stay attached for up to 2 weeks. TICKS MUST BE REMOVED AS SOON AS POSSIBLE TO HELP PREVENT DISEASES CAUSED BY TICK BITES.  TO REMOVE A TICK: 1. If available, put on latex gloves before trying to remove a tick.  2. Grasp the tick as close to the skin as possible, with curved forceps, fine tweezers or a special tick removal tool.  3. Pull gently with steady pressure until the tick lets go. Do not twist the tick or jerk it suddenly. This may break off the tick's head or mouth parts.  4. Do not crush the tick's body. This could force disease-carrying fluids from the tick into your body.  5. After the tick is removed, wash the bite area and your hands with soap and water or other disinfectant.  6. Apply a small amount of antiseptic cream or ointment to the bite site.  7. Wash and disinfect any instruments that were used.  8. Save the tick in a jar or plastic bag for later identification. Preserve the tick with a bit of alcohol or put it in the freezer.  9. Do not apply a hot match, petroleum jelly, or fingernail polish to the tick. This does not work and may  increase the chances of disease from the tick bite.  YOU MAY NEED TO SEE YOUR CAREGIVER FOR A TETANUS SHOT NOW IF:  You have no idea when you had the last one.   You have never had a tetanus shot before.  If you need a tetanus shot, and you decide not to get one, there is a rare chance of getting tetanus. Sickness from tetanus can be serious. If you get a tetanus shot, your arm may swell, get red and warm to the touch at the shot site. This is common and not a problem. PREVENTION  Wear protective clothing. Long sleeves and pants are best.   Wear white clothes to see ticks more easily   Tuck your pant legs into your socks.   If walking on trail, stay in the middle of the trail to avoid brushing against bushes.   Put insect repellent on all exposed skin and along boot tops, pant legs and sleeve cuffs   Check clothing, hair and skin repeatedly and before coming inside.   Brush off any ticks that are not attached.  SEEK MEDICAL CARE IF:   You cannot remove a tick or part of the tick that is left in the skin.   Unexplained fever.   Redness and swelling in the area of the tick bite.   Tender,  swollen lymph glands.   Diarrhea.   Weight loss.   Cough.   Fatigue.   Muscle, joint or bone pain.   Belly pain.   Headache.   Rash.  SEEK IMMEDIATE MEDICAL CARE IF:   You develop an oral temperature above 102 F (38.9 C).   You are having trouble walking or moving your legs.   Numbness in the legs.   Shortness of breath.   Confusion.   Repeated vomiting.  Document Released: 01/05/2000 Document Revised: 12/27/2010 Document Reviewed: 12/14/2007 [facility] Patient Information 2012 [place], [place].

## 2011-07-22 NOTE — ED Notes (Signed)
Tick bite to lt hip, removed 2 days ago, Now red and swollen

## 2011-07-22 NOTE — ED Notes (Signed)
Patient with no complaints at this time. Respirations even and unlabored. Skin warm/dry. Discharge instructions reviewed with patient at this time. Patient given opportunity to voice concerns/ask questions. Patient discharged at this time and left Emergency Department with steady gait.   

## 2011-07-22 NOTE — ED Provider Notes (Signed)
History     CSN: [id number]  Arrival date & time 07/22/11  1305   First MD Initiated Contact with Patient 07/22/11 1507      No chief complaint on file.   (Consider location/radiation/quality/duration/timing/severity/associated sxs/prior treatment) HPI Comments: Patient states that she had a tick removed approximately 2 days ago from the left hip and thigh area. Following this she has some mild redness around the bite site. And noticed a swollen lymph node in the left inguinal area. The patient has not had high fevers. She's not had nausea vomiting. She's not had any other unusual rash. It is of note that she had a similar episode after a tick bite last year. She required antibiotic therapy. The patient presents at this time for evaluation and to be placed on antibiotic therapy.  The history is provided by the patient.    Past Medical History  Diagnosis Date  . Constipation   . 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 Migraine   . Condyloma acuminata 01/2011    biopsy removed    Past Surgical History  Procedure Date  . Cesarean section   . Colposcopy     Family History  Problem Relation Age of Onset  . Thyroid disease Mother   . Diabetes Maternal Uncle   . Diabetes Maternal Grandmother   . Hypertension Maternal Grandmother   . Heart disease Maternal Grandmother   . Heart disease Paternal Grandfather   . Cancer Paternal Grandfather     prostate    History  Substance Use Topics  . Smoking status: Former Games developer  . Smokeless tobacco: Never Used  . Alcohol Use: Yes     rare    OB History    Grav Para Term Preterm Abortions TAB SAB Ect Mult Living   2 2        2       Review of Systems  Constitutional: Negative for activity change.       All ROS Neg except as noted in HPI  HENT: Negative for nosebleeds and neck pain.   Eyes: Negative for photophobia and  discharge.  Respiratory: Negative for cough, shortness of breath and wheezing.   Cardiovascular: Negative for chest pain and palpitations.  Gastrointestinal: Positive for constipation. Negative for abdominal pain and blood in stool.  Genitourinary: Negative for dysuria, frequency and hematuria.  Musculoskeletal: Negative for back pain and arthralgias.  Skin: Negative.   Neurological: Negative for dizziness, seizures and speech difficulty.  Psychiatric/Behavioral: Negative for hallucinations and confusion.    Allergies  Doxycycline  Home Medications   Current Outpatient Rx  Name Route Sig Dispense Refill  . HYDROCORTISONE 0.5 % EX CREA Topical Apply 1 application topically as needed. For skin irritation    . TOPIRAMATE 100 MG PO TABS Oral Take 100 mg by mouth at bedtime.    . CEPHALEXIN 500 MG PO CAPS  1 po qid with food. 28 capsule 0    BP 105/71  Pulse 74  Temp 98.3 F (36.8 C) (Oral)  Resp 20  Wt 129 lb (58.514 kg)  SpO2 99%  LMP 07/16/2011  Physical Exam  Nursing note and vitals reviewed. Constitutional: She is oriented to person, place, and time. She appears well-developed and well-nourished.  Non-toxic appearance.  HENT:  Head: Normocephalic.  Right Ear: Tympanic membrane and external ear normal.  Left Ear: Tympanic membrane and external ear  normal.  Eyes: EOM and lids are normal. Pupils are equal, round, and reactive to light.  Neck: Normal range of motion. Neck supple. Carotid bruit is not present.  Cardiovascular: Normal rate, regular rhythm, normal heart sounds, intact distal pulses and normal pulses.   Pulmonary/Chest: Breath sounds normal. No respiratory distress.  Abdominal: Soft. Bowel sounds are normal. There is no tenderness. There is no guarding.  Musculoskeletal: Normal range of motion.       There is a small bump with crusting surrounded by a quarter size area of redness. There no foreign body noted at this bite site. No red streaks appreciated. There is  a marble size lymph node appreciated in the left inguinal area.  Lymphadenopathy:       Head (right side): No submandibular adenopathy present.       Head (left side): No submandibular adenopathy present.    She has no cervical adenopathy.  Neurological: She is alert and oriented to person, place, and time. She has normal strength. No cranial nerve deficit or sensory deficit.  Skin: Skin is warm and dry.  Psychiatric: She has a normal mood and affect. Her speech is normal.    ED Course  Procedures (including critical care time)  Labs Reviewed - No data to display No results found.   1. Tick bite       MDM  I have reviewed nursing notes, vital signs, and all appropriate lab and imaging results for this patient. Patient sustained a tick bite to the left hip approximately 2 days ago. She presents to the emergency department to receive antibiotic therapy for this. The patient has tried doxycycline in the past and it has caused her to be severely ill. The patient requests not to be placed on this medication. Keflex 4 times daily ordered for the patient along with warm tub soaks. The patient is to return if any changes problems or complications.       Kathie Dike, [place] 07/25/11 1020

## 2011-07-27 NOTE — ED Provider Notes (Signed)
Medical screening examination/treatment/procedure(s) were performed by non-physician practitioner and as supervising physician I was immediately available for consultation/collaboration.   Shelda Jakes, MD 07/27/11 [phone]

## 2012-06-04 ENCOUNTER — Encounter: Payer: Self-pay | Admitting: Gynecology

## 2012-06-04 ENCOUNTER — Ambulatory Visit (INDEPENDENT_AMBULATORY_CARE_PROVIDER_SITE_OTHER): Payer: BC Managed Care – PPO | Admitting: Gynecology

## 2012-06-04 DIAGNOSIS — N898 Other specified noninflammatory disorders of vagina: Secondary | ICD-10-CM

## 2012-06-04 DIAGNOSIS — N920 Excessive and frequent menstruation with regular cycle: Secondary | ICD-10-CM

## 2012-06-04 DIAGNOSIS — N946 Dysmenorrhea, unspecified: Secondary | ICD-10-CM

## 2012-06-04 DIAGNOSIS — Z309 Encounter for contraceptive management, unspecified: Secondary | ICD-10-CM

## 2012-06-04 DIAGNOSIS — N938 Other specified abnormal uterine and vaginal bleeding: Secondary | ICD-10-CM

## 2012-06-04 DIAGNOSIS — N949 Unspecified condition associated with female genital organs and menstrual cycle: Secondary | ICD-10-CM

## 2012-06-04 LAB — CBC WITH DIFFERENTIAL/PLATELET
Basophils Absolute: 0 10*3/uL (ref 0.0–0.1)
Hemoglobin: 12.8 g/dL (ref 12.0–15.0)
Lymphocytes Relative: 20 % (ref 12–46)
Lymphs Abs: 0.6 10*3/uL — ABNORMAL LOW (ref 0.7–4.0)
MCV: 85.7 fL (ref 78.0–100.0)
Monocytes Absolute: 0.2 10*3/uL (ref 0.1–1.0)
Neutro Abs: 2 10*3/uL (ref 1.7–7.7)
RDW: 13.7 % (ref 11.5–15.5)

## 2012-06-04 LAB — WET PREP FOR TRICH, YEAST, CLUE
Clue Cells Wet Prep HPF POC: NONE SEEN
Yeast Wet Prep HPF POC: NONE SEEN

## 2012-06-04 MED ORDER — METRONIDAZOLE 500 MG PO TABS: 500.0000 mg | ORAL_TABLET | Freq: Two times a day (BID) | ORAL | Status: DC

## 2012-06-04 NOTE — Progress Notes (Signed)
Patient presents with several issues: 1. Menorrhagia. Patient has 2 periods are very heavy with clots and very crampy lasting 5 days. Frequent pad changes. In like this for over a year. 2. Breakthrough bleeding this past cycle. Her LMP was early May and since then has been spotting almost on a daily basis. Also having some right lower quadrant pain and she wondered whether she had an ovarian cyst that ruptured. The pain seems to be getting better. No nausea vomiting diarrhea constipation or urinary symptoms. 3. Contraception. Using withdrawal method. 4. History of dysplasia overdue for her annual exam and Pap smear.   Exam with chem assistant Abdomen soft nontender without masses guarding rebound organomegaly. Pelvic external BUS vagina with yellowish discharge. Cervix normal. Uterus anteverted normal size mobile nontender. Adnexa without masses or tenderness.  Assessment and plan: 1. Menorrhagia, long-standing. Check baseline CBC TSH and sonohysterogram. Discussed treatment options in conjunction with the need for contraception. Low dose oral contraceptives, NuvaRing, progesterone only, IUD reviewed. We'll further discuss after above studies. 2. Breakthrough bleeding this cycle. Right-sided pain. Start with ultrasound rule out ovarian cyst. GC chlamydia screen done. 3. Vaginal discharge. Not initially reported by patient admitted to noticing when I pointed it out. Wet prep consistent with bacterial vaginosis. Treat with vital 500 mg twice a day x7 days, alcohol avoidance reviewed. 4. Contraception. I reviewed as per #1. She is going to think of her options. This hopefully will also address #2. We'll rediscuss after studies return. 5. History of dysplasia. Has annual exam scheduled end of June and will followup for this and will do Pap smear then.

## 2012-06-04 NOTE — Patient Instructions (Signed)
Take Flagyl medication twice daily for 7 days. Avoid alcohol while taking. Followup for ultrasound as scheduled.

## 2012-06-05 LAB — URINALYSIS W MICROSCOPIC + REFLEX CULTURE
Hgb urine dipstick: NEGATIVE
Leukocytes, UA: NEGATIVE
Nitrite: NEGATIVE
Protein, ur: NEGATIVE mg/dL
pH: 6 (ref 5.0–8.0)

## 2012-06-05 LAB — HCG, SERUM, QUALITATIVE: Preg, Serum: NEGATIVE

## 2012-06-26 ENCOUNTER — Ambulatory Visit (INDEPENDENT_AMBULATORY_CARE_PROVIDER_SITE_OTHER): Payer: BC Managed Care – PPO | Admitting: Gynecology

## 2012-06-26 ENCOUNTER — Other Ambulatory Visit: Payer: Self-pay | Admitting: Gynecology

## 2012-06-26 ENCOUNTER — Encounter: Payer: Self-pay | Admitting: Gynecology

## 2012-06-26 ENCOUNTER — Ambulatory Visit (INDEPENDENT_AMBULATORY_CARE_PROVIDER_SITE_OTHER): Payer: BC Managed Care – PPO

## 2012-06-26 DIAGNOSIS — N920 Excessive and frequent menstruation with regular cycle: Secondary | ICD-10-CM

## 2012-06-26 DIAGNOSIS — N921 Excessive and frequent menstruation with irregular cycle: Secondary | ICD-10-CM

## 2012-06-26 DIAGNOSIS — N946 Dysmenorrhea, unspecified: Secondary | ICD-10-CM

## 2012-06-26 DIAGNOSIS — N923 Ovulation bleeding: Secondary | ICD-10-CM

## 2012-06-26 DIAGNOSIS — N840 Polyp of corpus uteri: Secondary | ICD-10-CM

## 2012-06-26 DIAGNOSIS — N879 Dysplasia of cervix uteri, unspecified: Secondary | ICD-10-CM

## 2012-06-26 NOTE — Progress Notes (Signed)
Patient presents for sonohysterogram with a history of menorrhagia and intermenstrual spotting for approximately one year. Also has history of dysplasia as outlined lobe.  Ultrasound shows uterus normal size. Endometrial echo 10.8 mm. Right and left ovaries normal. Cul-de-sac negative.  Sonohysterogram performed, sterile technique, single-tooth tenaculum anterior lip stabilization, easy catheter introduction, the distention with 8 x 6 x 5 mm anterior polyp. Endometrial sample taken. Patient tolerated well.  Assessment and plan: 1. Menorrhagia/intermenstrual bleeding. Sonohysterogram consistent with endometrial polyp. Recommend hysteroscopy D&C with resection of polyp. Discussed risks benefits. No guarantee it will relieve her symptoms. Risks of bleeding, transfusion, infection, uterine perforation with damage to internal organs including bowel bladder ureters vessels and nerves will necessitating major reparative surgeries and future repair surgeries including ostomy formation bowel resection and bladder repair ureteral damage repair all discussed with her. Distended media distortion with metabolic complications such as coma seizures reviewed. Patient agrees and we will schedule but reference #2 I want to assess her cervical dysplasia before hand because if cervical treatment needed may be able to do at the same time as the hysteroscopy D&C. 2. Cervical dysplasia. Patient has history of moderate cervical dysplasia 2008 with planned expectant management. 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 Colposcopic biopsy 02/2011 showed LGSIL with recommended repeat Pap smear in 6 months which she did not followup for. She is due now and has a annual exam scheduled and we will go ahead and accelerate that appointment to do her Pap smear before hand so that we can have this information before proceeding  with scheduling her hysteroscopy D&C.

## 2012-06-26 NOTE — Patient Instructions (Signed)
Office will call you to schedule your full exam with Pap smear before scheduling the hysteroscopy D&C. Office will also call you with the biopsy results from the ultrasound appointment.

## 2012-06-29 ENCOUNTER — Other Ambulatory Visit (HOSPITAL_COMMUNITY)
Admission: RE | Admit: 2012-06-29 | Discharge: 2012-06-29 | Disposition: A | Discharge status: Home / Self Care | Payer: BC Managed Care – PPO | Source: Ambulatory Visit | Attending: Gynecology | Admitting: Gynecology

## 2012-06-29 ENCOUNTER — Encounter: Payer: Self-pay | Admitting: Gynecology

## 2012-06-29 ENCOUNTER — Ambulatory Visit (INDEPENDENT_AMBULATORY_CARE_PROVIDER_SITE_OTHER): Payer: BC Managed Care – PPO | Admitting: Gynecology

## 2012-06-29 VITALS — BP 112/64 | Ht 64.0 in | Wt 142.0 lb

## 2012-06-29 DIAGNOSIS — Z13220 Encounter for screening for lipoid disorders: Secondary | ICD-10-CM

## 2012-06-29 DIAGNOSIS — Z1151 Encounter for screening for human papillomavirus (HPV): Secondary | ICD-10-CM | POA: Insufficient documentation

## 2012-06-29 DIAGNOSIS — Z01419 Encounter for gynecological examination (general) (routine) without abnormal findings: Secondary | ICD-10-CM

## 2012-06-29 LAB — CBC WITH DIFFERENTIAL/PLATELET
Eosinophils Relative: 3 % (ref 0–5)
HCT: 38 % (ref 36.0–46.0)
Lymphocytes Relative: 35 % (ref 12–46)
Lymphs Abs: 1.6 10*3/uL (ref 0.7–4.0)
MCV: 86 fL (ref 78.0–100.0)
Monocytes Absolute: 0.4 10*3/uL (ref 0.1–1.0)
RBC: 4.42 MIL/uL (ref 3.87–5.11)
WBC: 4.7 10*3/uL (ref 4.0–10.5)

## 2012-06-29 LAB — LIPID PANEL
Cholesterol: 171 mg/dL (ref 0–200)
HDL: 44 mg/dL (ref >39)
Total CHOL/HDL Ratio: 3.9 Ratio
VLDL: 20 mg/dL (ref 0–40)

## 2012-06-29 NOTE — Patient Instructions (Signed)
Office will contact you with Pap smear results. Call us if you do not hear within one to 2 weeks.

## 2012-06-29 NOTE — Addendum Note (Signed)
Addended by: Dayna Barker on: 06/29/2012 10:00 AM   Modules accepted: Orders

## 2012-06-29 NOTE — Progress Notes (Signed)
Jacqueline Stewart July 27, 1985 [id number]        27 y.o.  [id number] for annual exam.  Several issues noted below.  Past medical history,surgical history, medications, allergies, family history and social history were all reviewed and documented in the EPIC chart.  ROS:  Performed and pertinent positives and negatives are included in the history, assessment and plan .  Exam: Kim assistant Filed Vitals:   06/29/12 0852  BP: 112/64  Height: 5\' 4"  (1.626 m)  Weight: 142 lb (64.411 kg)   General appearance  Normal Skin grossly normal Head/Neck normal with no cervical or supraclavicular adenopathy thyroid normal Lungs  clear Cardiac RR, without RMG Abdominal  soft, nontender, without masses, organomegaly or hernia Breasts  examined lying and sitting without masses, retractions, discharge or axillary adenopathy. Pelvic  Ext/BUS/vagina  normal   Cervix  normal Pap done  Uterus  anteverted, normal size, shape and contour, midline and mobile nontender   Adnexa  Without masses or tenderness    Anus and perineum  normal   Rectovaginal  normal sphincter tone without palpated masses or tenderness.    Assessment/Plan:  27 y.o. [id number] female for annual exam.   1. Menorrhagia. Patient recently had sonohysterogram which showed endometrial polyp and she is in the process of arranging hysteroscopy D&C. We wanted to get the Pap smear first given her history as noted below. 2. Pap smear done today.  Patient has history of moderate cervical dysplasia 2008 with planned expectant management. 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 Colposcopic biopsy 02/2011 showed LGSIL with recommended repeat Pap smear in 6 months which she did not followup for. Pap done today. If abnormal then plan colposcopy. Ultimately may consider treatment such as LEEP in conjunction with her hysteroscopy D&C noted  above. Patient knows to call in one week in followup of her Pap smear so that we can decide course of action. 3. Contraceptive management. Patient desiring oral contraceptives. She does have a history of migraines which she takes Topamax every day and does have some auras. She has a history of smoking although has quit smoking. I discussed my reluctance to use estrogen containing products given this history for risk of stroke. The issues of thrombosis such as stroke heart attack DVT reviewed. She had a Mirena IUD previously and did fine with this with the exception that apparently her husband did feel the string. I reviewed this option with her also that we could trim the string in the canal so that he would not feel this. She states at this time that she really does not care if she becomes pregnant again that that would be okay as with her husband. At this point have recommended continuing with her current method until we resolve the above issues. 4. Breast health. SBE monthly reviewed. 5. Health maintenance. Baseline CBC glucose lipid profile urinalysis ordered. Followup for Pap smear results and ultimately for her hysteroscopy D&C.    Dara Lords MD, 9:30 AM 06/29/2012

## 2012-06-30 LAB — URINALYSIS W MICROSCOPIC + REFLEX CULTURE
Bilirubin Urine: NEGATIVE
Glucose, UA: NEGATIVE mg/dL
Hgb urine dipstick: NEGATIVE
Protein, ur: NEGATIVE mg/dL
Squamous Epithelial / LPF: NONE SEEN
Urobilinogen, UA: 0.2 mg/dL (ref 0.0–1.0)

## 2012-07-17 ENCOUNTER — Encounter: Payer: Self-pay | Admitting: Gynecology

## 2012-07-28 ENCOUNTER — Telehealth: Payer: Self-pay

## 2012-07-28 NOTE — Telephone Encounter (Signed)
I called patient because I spoke with her on 07/08/12 about surgery. We scheduled it for 08/13/12 but she was away on vacation and said she would call me when she returned. She never did.  I left message today for her to call me and let me know if she is confirmed for 08/13/12.

## 2012-07-29 ENCOUNTER — Telehealth: Payer: Self-pay

## 2012-07-29 NOTE — Telephone Encounter (Signed)
Patient is tentatively (pending ins worked out) scheduled for Hysteroscopy, D&C   for removal of endo polyp.  She said today she will be so happy to get this done because up four times a night to urinate  and gets back in bed and has to go again immediately.  She said a lot of pressure.  She seems to think that the endometrial polyp is causing these symptoms and they are going to be alleviated with removal.  I told her I thought that was doubtful but I would be happy to ask your opinion.  She said she has had these exact symptoms since the birth of her second child. Her urinalysis was normal in June 2014.

## 2012-07-29 NOTE — Telephone Encounter (Signed)
Doubt related

## 2012-07-29 NOTE — Telephone Encounter (Signed)
Patient called today and apologized for not having called me back. She still does hope to do her surgery on 08/13/12 but presently has some insurance concerns. She quit her job and her ins term'd on 07/16/12.  She is in the process of being added to her husband's ins. She will call me tomorrow to let me know what the status of that is.  I told her it will be essential that I can verify ins and her financial obligation prior to surgery or we will ask her to pay the cost of the surgeon's fee in full in advance.  She understands. She will keep me posted.

## 2012-07-29 NOTE — Telephone Encounter (Signed)
Patient informed. 

## 2012-08-04 ENCOUNTER — Encounter (HOSPITAL_COMMUNITY): Payer: Self-pay | Admitting: *Deleted

## 2012-08-05 ENCOUNTER — Telehealth: Payer: Self-pay

## 2012-08-05 NOTE — Telephone Encounter (Signed)
Patient called to let me know she had quit her job last week and would be being added to [employer]. She called me to day to give me her new insurance info. I called and checked benefits and called her back to let her know her surgery prepayment amount, etc. Patient has $1000 ded , 70/30 co-insurance. She said there is not anyway she can afford to have surgery now. She said it will be financially challenging with her being out of work just to take care of routine finances.  She said it will probably be next year when they get tax refund before she will be able to do this. She asked me regarding payment plan, etc. I will check on this and let her know.

## 2012-08-06 NOTE — Telephone Encounter (Signed)
Left message for patient to call me to discuss payment plan.

## 2012-08-07 ENCOUNTER — Telehealth: Payer: Self-pay

## 2012-08-07 NOTE — Telephone Encounter (Signed)
Patient called. I informed her that we could set up a payment plan but she would still have to half down and then could make payments x 6 weeks.  I did tell her that Dr. Velvet Bathe does not think it is anything bad such as cancer but he cannot guarantee her of that and if she chooses to postpone her surgery she will have to take that responsibility as Dr. Velvet Bathe is willing to do her surgery next week. She understands this but still wants me to cancel her surgery for next week. She said she will call me as soon as she feels she can work this out. She has my direct number.

## 2012-08-13 ENCOUNTER — Ambulatory Visit (HOSPITAL_COMMUNITY): Admission: RE | Admit: 2012-08-13 | Payer: Self-pay | Source: Ambulatory Visit | Admitting: Gynecology

## 2012-08-13 HISTORY — DX: Polyp of corpus uteri: N84.0

## 2012-08-13 SURGERY — DILATATION & CURETTAGE/HYSTEROSCOPY WITH TRUCLEAR
Anesthesia: General

## 2012-11-03 ENCOUNTER — Encounter (HOSPITAL_COMMUNITY): Payer: Self-pay | Admitting: Emergency Medicine

## 2012-11-03 ENCOUNTER — Emergency Department (HOSPITAL_COMMUNITY)
Admission: EM | Admit: 2012-11-03 | Discharge: 2012-11-03 | Disposition: A | Discharge status: Home / Self Care | Payer: PRIVATE HEALTH INSURANCE | Attending: Emergency Medicine | Admitting: Emergency Medicine

## 2012-11-03 DIAGNOSIS — R509 Fever, unspecified: Secondary | ICD-10-CM | POA: Insufficient documentation

## 2012-11-03 DIAGNOSIS — R062 Wheezing: Secondary | ICD-10-CM | POA: Insufficient documentation

## 2012-11-03 DIAGNOSIS — J029 Acute pharyngitis, unspecified: Secondary | ICD-10-CM | POA: Insufficient documentation

## 2012-11-03 DIAGNOSIS — G43909 Migraine, unspecified, not intractable, without status migrainosus: Secondary | ICD-10-CM | POA: Insufficient documentation

## 2012-11-03 DIAGNOSIS — Z8619 Personal history of other infectious and parasitic diseases: Secondary | ICD-10-CM | POA: Insufficient documentation

## 2012-11-03 DIAGNOSIS — Z8742 Personal history of other diseases of the female genital tract: Secondary | ICD-10-CM | POA: Insufficient documentation

## 2012-11-03 DIAGNOSIS — J40 Bronchitis, not specified as acute or chronic: Secondary | ICD-10-CM

## 2012-11-03 DIAGNOSIS — J209 Acute bronchitis, unspecified: Secondary | ICD-10-CM | POA: Insufficient documentation

## 2012-11-03 DIAGNOSIS — Z87891 Personal history of nicotine dependence: Secondary | ICD-10-CM | POA: Insufficient documentation

## 2012-11-03 DIAGNOSIS — Z8719 Personal history of other diseases of the digestive system: Secondary | ICD-10-CM | POA: Insufficient documentation

## 2012-11-03 MED ORDER — PHENYLEPH-PROMETHAZINE-COD 5-6.25-10 MG/5ML PO SYRP: 5.0000 mL | ORAL_SOLUTION | ORAL | Status: DC | PRN

## 2012-11-03 MED ORDER — PREDNISONE 10 MG PO TABS: 20.0000 mg | ORAL_TABLET | Freq: Every day | ORAL | Status: DC

## 2012-11-03 MED ORDER — ALBUTEROL SULFATE HFA 108 (90 BASE) MCG/ACT IN AERS
2.0000 | INHALATION_SPRAY | RESPIRATORY_TRACT | Status: DC | PRN
Start: 2012-11-03 — End: 2012-11-03
  Administered 2012-11-03: 2 via RESPIRATORY_TRACT
  Filled 2012-11-03: qty 6.7

## 2012-11-03 MED ORDER — AZITHROMYCIN 250 MG PO TABS: ORAL_TABLET | ORAL | Status: DC

## 2012-11-03 NOTE — ED Provider Notes (Signed)
CSN: [id number]     Arrival date & time 11/03/12  1106 History   First MD Initiated Contact with Patient 11/03/12 1130     No chief complaint on file.  (Consider location/radiation/quality/duration/timing/severity/associated sxs/prior Treatment) Patient is a 27 y.o. female presenting with cough. The history is provided by the patient.  Cough Cough characteristics:  Productive Sputum characteristics:  Green Severity:  Moderate Onset quality:  Gradual Duration:  1 week Timing:  Sporadic Progression:  Worsening Chronicity:  New Smoker: no   Relieved by:  Nothing Worsened by:  Lying down and activity Ineffective treatments:  Steam and cough suppressants Associated symptoms: chills, fever, rhinorrhea, sinus congestion and sore throat   Associated symptoms: no ear fullness, no ear pain, no eye discharge, no headaches, no myalgias, no rash, no shortness of breath and no wheezing    FADUMO HENG is a 27 y.o. female who presents to the ED with cough, cold and sinus congestion that started last week. She has taken several OTC medications without relief. She complains of sinus pressure and nasal congestion.   Past Medical History  Diagnosis Date  . Constipation   . 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 Migraine   . Condyloma acuminata 01/2011    biopsy removed  . Uterine polyp    Past Surgical History  Procedure Laterality Date  . Cesarean section    . Colposcopy     Family History  Problem Relation Age of Onset  . Thyroid disease Mother   . Diabetes Maternal Uncle   . Diabetes Maternal Grandmother   . Hypertension Maternal Grandmother   . Heart disease Maternal Grandmother   . Heart disease Paternal Grandfather   . Cancer Paternal Grandfather     prostate   History  Substance Use Topics  . Smoking status: Former Smoker    Quit date: 08/05/2010  . Smokeless  tobacco: Never Used  . Alcohol Use: Yes     Comment: rare   OB History   Grav Para Term Preterm Abortions TAB SAB Ect Mult Living   2 2 2       2      Review of Systems  Constitutional: Positive for fever and chills.  HENT: Positive for rhinorrhea and sore throat. Negative for ear pain.   Eyes: Negative for discharge and visual disturbance.  Respiratory: Positive for cough. Negative for shortness of breath and wheezing.   Gastrointestinal: Negative for nausea, vomiting and abdominal pain.  Genitourinary: Negative for dysuria and frequency.  Musculoskeletal: Negative for myalgias.  Skin: Negative for rash.  Neurological: Negative for headaches.  Psychiatric/Behavioral: The patient is not nervous/anxious.     Allergies  Doxycycline  Home Medications   Current Outpatient Rx  Name  Route  Sig  Dispense  Refill  . Rizatriptan Benzoate (MAXALT PO)   Oral   Take by mouth.         . topiramate (TOPAMAX) 100 MG tablet   Oral   Take 100 mg by mouth at bedtime.          BP 105/70  Pulse 82  Temp(Src) 98.3 F (36.8 C) (Oral)  Resp 20  Wt 156 lb (70.761 kg)  BMI 26.76 kg/m2  SpO2 98%  LMP 10/06/2012 Physical Exam  Nursing note and vitals reviewed. Constitutional: She is oriented to person, place, and time. She appears well-developed and well-nourished.  HENT:  Head: Normocephalic and atraumatic.  Right Ear: Tympanic membrane normal.  Left Ear: Tympanic membrane normal.  Nose: Rhinorrhea present. Right sinus exhibits maxillary sinus tenderness. Left sinus exhibits maxillary sinus tenderness.  Eyes: Conjunctivae and EOM are normal. Pupils are equal, round, and reactive to light.  Neck: Normal range of motion. Neck supple.  Cardiovascular: Normal rate, regular rhythm and normal heart sounds.   Pulmonary/Chest: Effort normal. She has wheezes. She has rhonchi.  Abdominal: Soft. Bowel sounds are normal. There is no tenderness.  Musculoskeletal: Normal range of motion.   Neurological: She is alert and oriented to person, place, and time. No cranial nerve deficit.  Skin: Skin is warm and dry.  Psychiatric: She has a normal mood and affect. Her behavior is normal.    ED Course  Procedures  MDM  27 y.o. female with bronchitis and sinus congestion. Will treat symptoms and encourage patient to stop smoking.  Discussed with the patient and all questioned fully answered. She will return if any problems arise. She is stable for discharge home without any immediate complications. Vital signs normal and O2 Sat 98% on R/A.   Medication List    TAKE these medications       azithromycin 250 MG tablet  Commonly known as:  ZITHROMAX Z-PAK  Take two tablets today and then one tablet daily     Phenyleph-Promethazine-Cod 5-6.25-10 MG/5ML Syrp  Take 5 mLs by mouth every 4 (four) hours as needed.     predniSONE 10 MG tablet  Commonly known as:  DELTASONE  Take 2 tablets (20 mg total) by mouth daily.      ASK your doctor about these medications       MAXALT PO  Take 1 tablet by mouth as needed (migraines).     topiramate 100 MG tablet  Commonly known as:  TOPAMAX  Take 100 mg by mouth at bedtime.           Janne Napoleon, [place] 11/03/12 1610

## 2012-11-03 NOTE — ED Notes (Signed)
Cough, sinus congestion , headache.  Runny nose

## 2012-11-03 NOTE — ED Provider Notes (Signed)
Medical screening examination/treatment/procedure(s) were performed by non-physician practitioner and as supervising physician I was immediately available for consultation/collaboration.   Kiasha Bellin L Kert Shackett, MD 11/03/12 1616 

## 2012-11-03 NOTE — ED Notes (Signed)
Awaiting resp therapy for inhaler education.

## 2012-11-26 ENCOUNTER — Other Ambulatory Visit: Payer: Self-pay

## 2013-11-14 IMAGING — CT CT ABD-PELV W/ CM
2 of 4 series · 17 of 46 positions shown, 19 images · IV contrast (omnipaque)
Comparison: CT scan 01/28/2004.

CLINICAL DATA: Abdominal pain.

CT ABDOMEN AND PELVIS WITH CONTRAST
TECHNIQUE: Multidetector CT imaging of the abdomen and pelvis was
performed following the standard protocol during bolus
administration of intravenous contrast.
Contrast: 100mL OMNIPAQUE IOHEXOL 300 MG/ML IV SOLN

[Series 2: routine abdomen · axial · 0.74mm/px · z∈[-417,-22]mm · 14 of 87 slices shown, 16 images]
[im 4/87  soft-tissue]
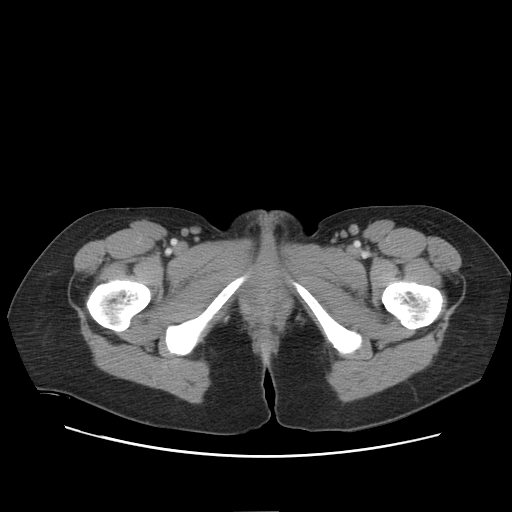
[im 4/87  bone]
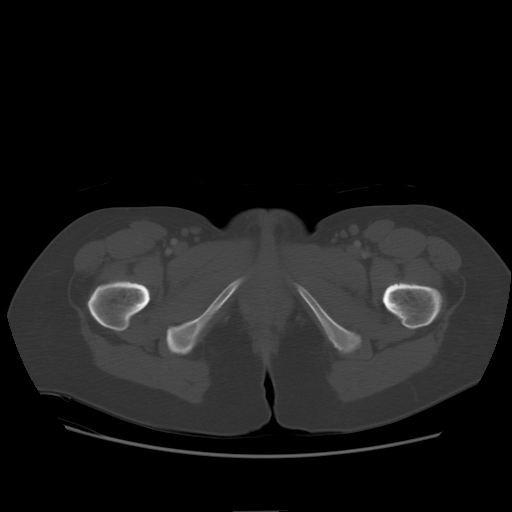
[im 12/87  soft-tissue]
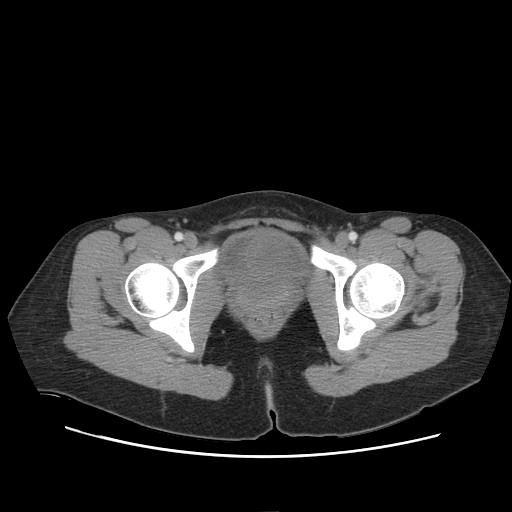
[im 15/87  soft-tissue]
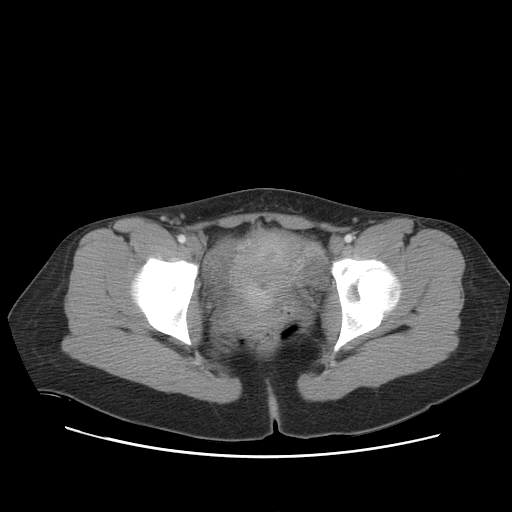
[im 23/87  soft-tissue]
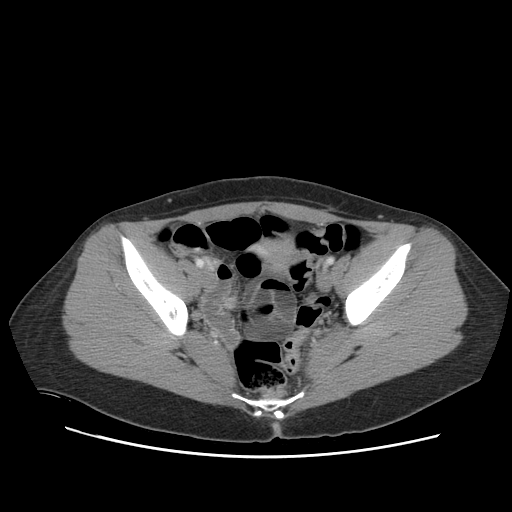
[im 30/87  soft-tissue]
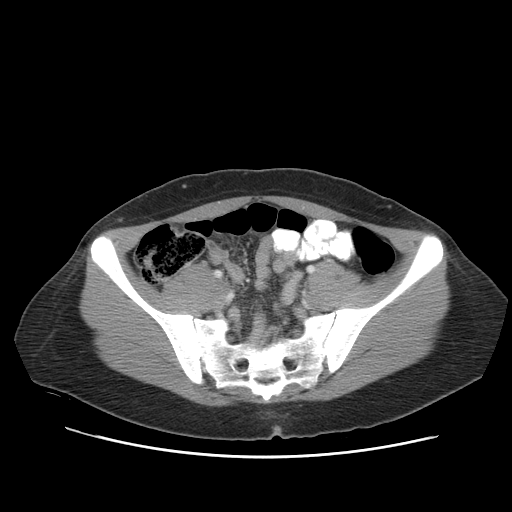
[im 34/87  soft-tissue]
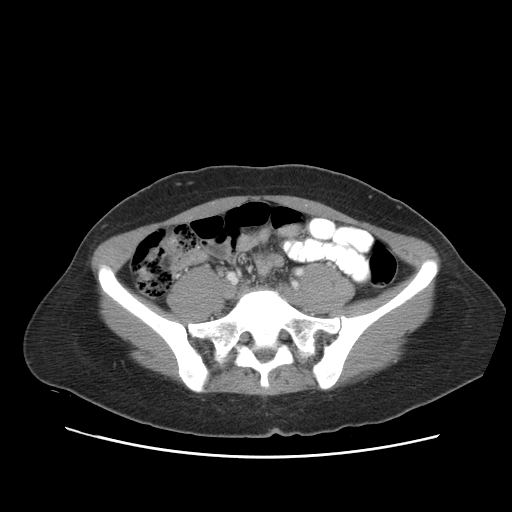
[im 42/87  soft-tissue]
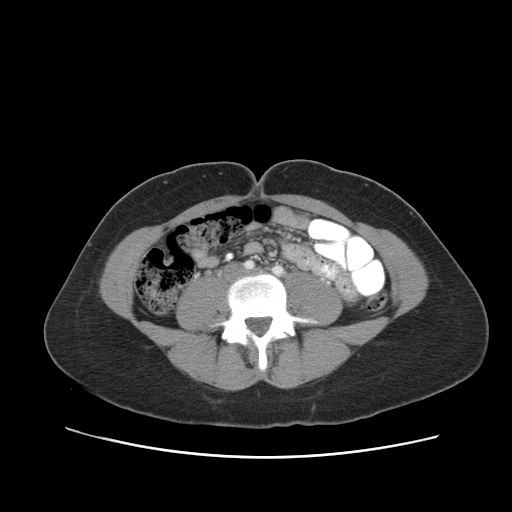
[im 45/87  soft-tissue]
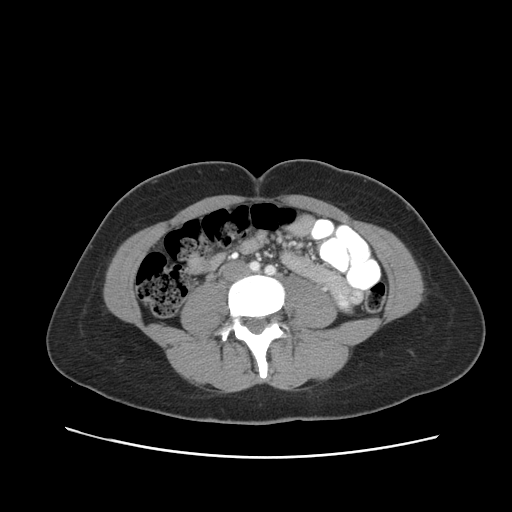
[im 53/87  soft-tissue]
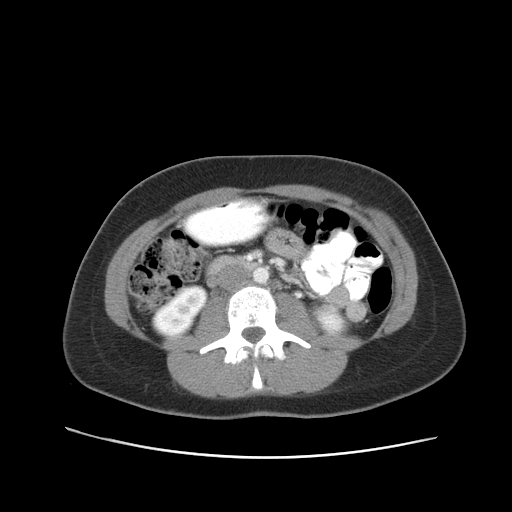
[im 53/87  bone]
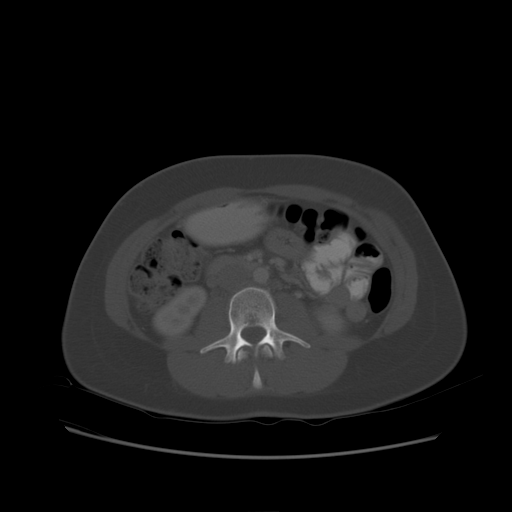
[im 57/87  soft-tissue]
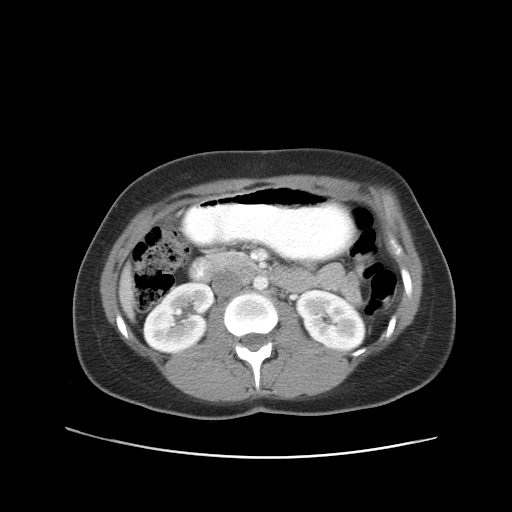
[im 64/87  soft-tissue]
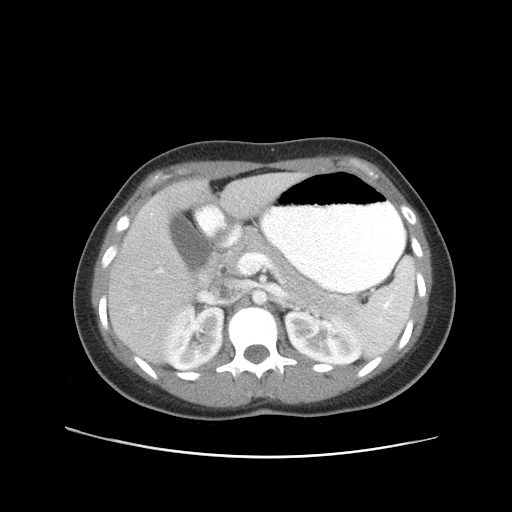
[im 72/87  soft-tissue]
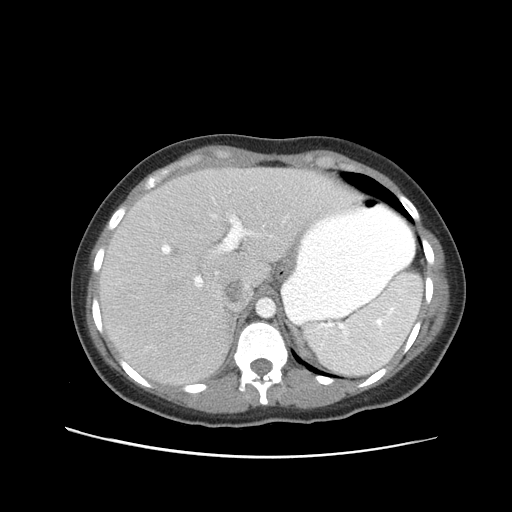
[im 75/87  soft-tissue]
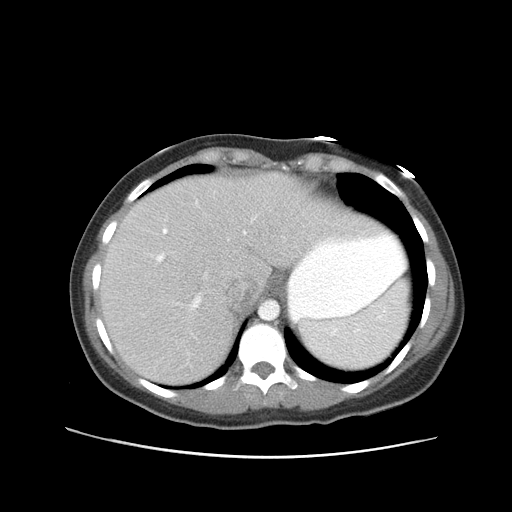
[im 83/87  soft-tissue]
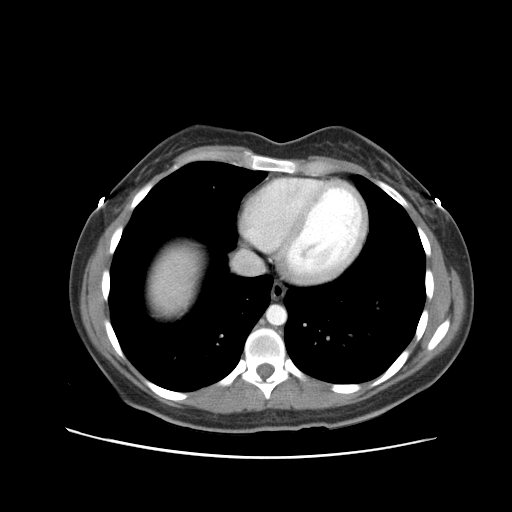

[Series 401: cor · coronal · 0.89mm/px · 3 of 83 slices shown]
[im 28/83  soft-tissue]
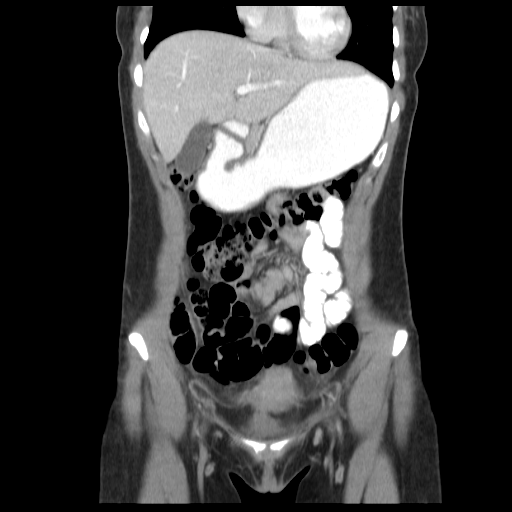
[im 37/83  soft-tissue]
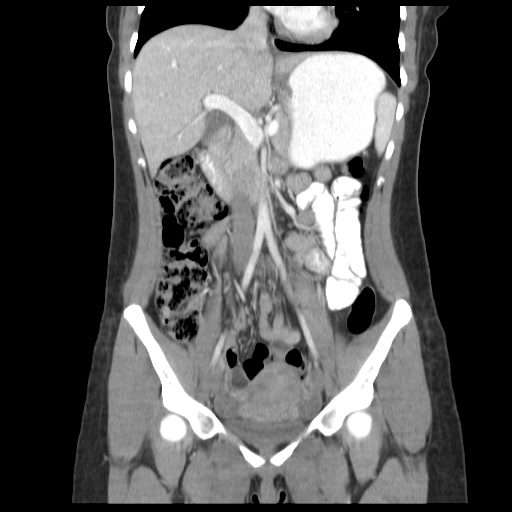
[im 46/83  soft-tissue]
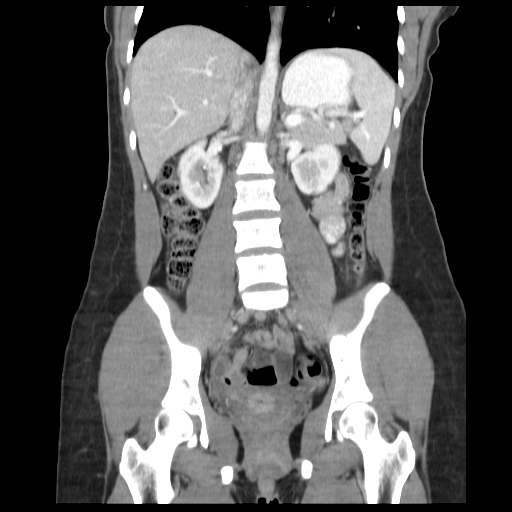

[17 of 46 positions shown; findings below may reference images not displayed]

FINDINGS: The lung bases are clear.

The solid abdominal organs are normal.  The gallbladder is normal.
No common bile duct dilatation.

The stomach, duodenum, small bowel and colon are unremarkable.  The
appendix is normal.

The uterus and ovaries are normal.

The aorta is normal in caliber.  The major branch vessels are
normal.  Small scattered mesenteric and retroperitoneal lymph nodes
but no mass or adenopathy.

The bladder is unremarkable.  No pelvic mass, adenopathy or free
pelvic fluid collections.  No inguinal mass or hernia.

The bony structures are unremarkable.
IMPRESSION: No acute abdominal/pelvic findings, mass lesions or adenopathy.

## 2013-11-22 ENCOUNTER — Encounter (HOSPITAL_COMMUNITY): Payer: Self-pay | Admitting: Emergency Medicine

## 2014-06-19 ENCOUNTER — Emergency Department (HOSPITAL_COMMUNITY)
Admission: EM | Admit: 2014-06-19 | Discharge: 2014-06-19 | Disposition: A | Discharge status: Home / Self Care | Payer: Managed Care, Other (non HMO) | Attending: Emergency Medicine | Admitting: Emergency Medicine

## 2014-06-19 ENCOUNTER — Encounter (HOSPITAL_COMMUNITY): Payer: Self-pay | Admitting: *Deleted

## 2014-06-19 ENCOUNTER — Emergency Department (HOSPITAL_COMMUNITY): Payer: Managed Care, Other (non HMO)

## 2014-06-19 DIAGNOSIS — Z8619 Personal history of other infectious and parasitic diseases: Secondary | ICD-10-CM | POA: Insufficient documentation

## 2014-06-19 DIAGNOSIS — Z8719 Personal history of other diseases of the digestive system: Secondary | ICD-10-CM | POA: Insufficient documentation

## 2014-06-19 DIAGNOSIS — M545 Low back pain: Secondary | ICD-10-CM | POA: Insufficient documentation

## 2014-06-19 DIAGNOSIS — N200 Calculus of kidney: Secondary | ICD-10-CM | POA: Diagnosis not present

## 2014-06-19 DIAGNOSIS — N898 Other specified noninflammatory disorders of vagina: Secondary | ICD-10-CM | POA: Insufficient documentation

## 2014-06-19 DIAGNOSIS — Z79899 Other long term (current) drug therapy: Secondary | ICD-10-CM | POA: Insufficient documentation

## 2014-06-19 DIAGNOSIS — R110 Nausea: Secondary | ICD-10-CM | POA: Diagnosis not present

## 2014-06-19 DIAGNOSIS — Z3202 Encounter for pregnancy test, result negative: Secondary | ICD-10-CM | POA: Diagnosis not present

## 2014-06-19 DIAGNOSIS — G43909 Migraine, unspecified, not intractable, without status migrainosus: Secondary | ICD-10-CM | POA: Insufficient documentation

## 2014-06-19 DIAGNOSIS — Z8541 Personal history of malignant neoplasm of cervix uteri: Secondary | ICD-10-CM | POA: Diagnosis not present

## 2014-06-19 DIAGNOSIS — R109 Unspecified abdominal pain: Secondary | ICD-10-CM | POA: Insufficient documentation

## 2014-06-19 DIAGNOSIS — Z8742 Personal history of other diseases of the female genital tract: Secondary | ICD-10-CM | POA: Insufficient documentation

## 2014-06-19 DIAGNOSIS — Z87891 Personal history of nicotine dependence: Secondary | ICD-10-CM | POA: Diagnosis not present

## 2014-06-19 LAB — CBC WITH DIFFERENTIAL/PLATELET
Basophils Absolute: 0 10*3/uL (ref 0.0–0.1)
Basophils Relative: 0 % (ref 0–1)
Eosinophils Absolute: 0.1 10*3/uL (ref 0.0–0.7)
Eosinophils Relative: 3 % (ref 0–5)
HCT: 40.2 % (ref 36.0–46.0)
HEMOGLOBIN: 13.6 g/dL (ref 12.0–15.0)
LYMPHS ABS: 1.6 10*3/uL (ref 0.7–4.0)
Lymphocytes Relative: 35 % (ref 12–46)
MCH: 29.4 pg (ref 26.0–34.0)
MCHC: 33.8 g/dL (ref 30.0–36.0)
MCV: 86.8 fL (ref 78.0–100.0)
Monocytes Absolute: 0.2 10*3/uL (ref 0.1–1.0)
Monocytes Relative: 5 % (ref 3–12)
NEUTROS ABS: 2.6 10*3/uL (ref 1.7–7.7)
Neutrophils Relative %: 57 % (ref 43–77)
Platelets: 249 10*3/uL (ref 150–400)
RBC: 4.63 MIL/uL (ref 3.87–5.11)
RDW: 13.2 % (ref 11.5–15.5)
WBC: 4.6 10*3/uL (ref 4.0–10.5)

## 2014-06-19 LAB — COMPREHENSIVE METABOLIC PANEL
ALBUMIN: 4.6 g/dL (ref 3.5–5.0)
ALT: 14 U/L (ref 14–54)
ANION GAP: 9 (ref 5–15)
AST: 15 U/L (ref 15–41)
Alkaline Phosphatase: 53 U/L (ref 38–126)
BUN: 9 mg/dL (ref 6–20)
CO2: 22 mmol/L (ref 22–32)
Calcium: 9.2 mg/dL (ref 8.9–10.3)
Chloride: 111 mmol/L (ref 101–111)
Creatinine, Ser: 0.97 mg/dL (ref 0.44–1.00)
GFR calc Af Amer: >60 mL/min (ref >60)
GFR calc non Af Amer: >60 mL/min (ref >60)
Glucose, Bld: 76 mg/dL (ref 65–99)
Potassium: 3.5 mmol/L (ref 3.5–5.1)
Sodium: 142 mmol/L (ref 135–145)
TOTAL PROTEIN: 7.5 g/dL (ref 6.5–8.1)
Total Bilirubin: 0.7 mg/dL (ref 0.3–1.2)

## 2014-06-19 LAB — WET PREP, GENITAL
TRICH WET PREP: NONE SEEN
YEAST WET PREP: NONE SEEN

## 2014-06-19 LAB — URINALYSIS, ROUTINE W REFLEX MICROSCOPIC
Glucose, UA: NEGATIVE mg/dL
HGB URINE DIPSTICK: NEGATIVE
Leukocytes, UA: NEGATIVE
NITRITE: NEGATIVE
PH: 5.5 (ref 5.0–8.0)
PROTEIN: NEGATIVE mg/dL
Specific Gravity, Urine: >1.03 — ABNORMAL HIGH (ref 1.005–1.030)
Urobilinogen, UA: 0.2 mg/dL (ref 0.0–1.0)

## 2014-06-19 LAB — I-STAT BETA HCG BLOOD, ED (MC, WL, AP ONLY): I-stat hCG, quantitative: <5 m[IU]/mL (ref <5)

## 2014-06-19 LAB — LIPASE, BLOOD: LIPASE: 21 U/L — AB (ref 22–51)

## 2014-06-19 LAB — PREGNANCY, URINE: Preg Test, Ur: NEGATIVE

## 2014-06-19 MED ORDER — ONDANSETRON HCL 4 MG/2ML IJ SOLN
4.0000 mg | Freq: Once | INTRAMUSCULAR | Status: AC
  Administered 2014-06-19: 4 mg via INTRAVENOUS
  Filled 2014-06-19: qty 2

## 2014-06-19 MED ORDER — ONDANSETRON 8 MG PO TBDP: 8.0000 mg | ORAL_TABLET | Freq: Three times a day (TID) | ORAL | Status: DC | PRN

## 2014-06-19 MED ORDER — HYDROMORPHONE HCL 1 MG/ML IJ SOLN
0.5000 mg | Freq: Once | INTRAMUSCULAR | Status: AC
  Administered 2014-06-19: 0.5 mg via INTRAVENOUS
  Filled 2014-06-19: qty 1

## 2014-06-19 MED ORDER — OXYCODONE-ACETAMINOPHEN 5-325 MG PO TABS: 1.0000 | ORAL_TABLET | ORAL | Status: DC | PRN

## 2014-06-19 NOTE — ED Provider Notes (Signed)
CSN: [id number]     Arrival date & time 06/19/14  1256 History   First MD Initiated Contact with Patient 06/19/14 1330     Chief Complaint  Patient presents with  . Flank Pain    left     (Consider location/radiation/quality/duration/timing/severity/associated sxs/prior Treatment) Patient is a 29 y.o. female presenting with flank pain.  Flank Pain   29 year old female comes in today complaining of left flank pain that began yesterday. She began having some nausea initially and states she had some pain in her lower back. She then began having flank pain. She has not vomited. She denies any UTI type symptoms. She thinks she may have had some vaginal discharge was more than usual. Her last menstrual period was 2 weeks ago with a normal menstrual cycle. She does take some medications for migraines. She has not taken anything for this flank pain. She has not had any similar symptoms in the past. She has had a UTI before. Denies any blood in her urine. She has not had fever or chills. She states that she is chronically constipated. She has not had any diarrhea or loose stools. She has occasionally have some blood when she wipes with toilet paper but has not noted any obvious rectal bleeding. She states she has been with one sexual partner for extended period time and does not have any history of STDs. Past Medical History  Diagnosis Date  . Constipation   . 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 Migraine   . Condyloma acuminata 01/2011    biopsy removed  . Uterine polyp    Past Surgical History  Procedure Laterality Date  . Cesarean section    . Colposcopy     Family History  Problem Relation Age of Onset  . Thyroid disease Mother   . Diabetes Maternal Uncle   . Diabetes Maternal Grandmother   . Hypertension Maternal Grandmother   . Heart disease Maternal Grandmother   . Heart  disease Paternal Grandfather   . Cancer Paternal Grandfather     prostate   History  Substance Use Topics  . Smoking status: Former Smoker    Quit date: 08/05/2010  . Smokeless tobacco: Never Used  . Alcohol Use: Yes     Comment: rare   OB History    Gravida Para Term Preterm AB TAB SAB Ectopic Multiple Living   2 2 2       2      Review of Systems  Genitourinary: Positive for flank pain.  All other systems reviewed and are negative.     Allergies  Doxycycline  Home Medications   Prior to Admission medications   Medication Sig Start Date End Date Taking? Authorizing Provider  phentermine (ADIPEX-P) 37.5 MG tablet Take 37.5 mg by mouth daily as needed (weight gain).  05/31/14  Yes Historical Provider, MD  Rizatriptan Benzoate (MAXALT PO) Take 1 tablet by mouth as needed (migraines).    Yes Historical Provider, MD  topiramate (TOPAMAX) 100 MG tablet Take 100 mg by mouth at bedtime.   Yes Historical Provider, MD  azithromycin (ZITHROMAX Z-PAK) 250 MG tablet Take two tablets today and then one tablet daily Patient not taking: Reported on 06/19/2014 11/03/12   Janne NapoleonHope M Neese, NP  Phenyleph-Promethazine-Cod 5-6.25-10 MG/5ML SYRP Take 5 mLs by mouth every 4 (four) hours as needed. Patient not taking: Reported on 06/19/2014 11/03/12  Hope Orlene Och, NP  predniSONE (DELTASONE) 10 MG tablet Take 2 tablets (20 mg total) by mouth daily. Patient not taking: Reported on 06/19/2014 11/03/12   Janne Napoleon, NP   BP 131/86 mmHg  Pulse 67  Temp(Src) 97.9 F (36.6 C) (Oral)  Resp 18  SpO2 100%  LMP 06/13/2014 Physical Exam  Constitutional: She appears well-developed and well-nourished.  HENT:  Head: Normocephalic and atraumatic.  Nose: Nose normal.  Mouth/Throat: Oropharynx is clear and moist.  Eyes: Conjunctivae and EOM are normal. Pupils are equal, round, and reactive to light.  Neck: Normal range of motion. Neck supple.  Cardiovascular: Normal rate, regular rhythm, normal heart sounds  and intact distal pulses.   Pulmonary/Chest: Effort normal and breath sounds normal.  Abdominal: Soft. Bowel sounds are normal.  Genitourinary: No tenderness in the vagina. Vaginal discharge found.  Nursing note and vitals reviewed.   ED Course  Procedures (including critical care time) Labs Review Labs Reviewed  URINALYSIS, ROUTINE W REFLEX MICROSCOPIC (NOT AT [facility]) - Abnormal; Notable for the following:    Specific Gravity, Urine >1.030 (*)    Bilirubin Urine SMALL (*)    Ketones, ur TRACE (*)    All other components within normal limits  LIPASE, BLOOD - Abnormal; Notable for the following:    Lipase 21 (*)    All other components within normal limits  WET PREP, GENITAL  PREGNANCY, URINE  CBC WITH DIFFERENTIAL/PLATELET  COMPREHENSIVE METABOLIC PANEL  OCCULT BLOOD X 1 CARD TO LAB, STOOL  RPR  HIV ANTIBODY (ROUTINE TESTING)  I-STAT BETA HCG BLOOD, ED ([facility], [facility], [facility] ONLY)  GC/CHLAMYDIA PROBE AMP ([place]) NOT AT [facility]    Imaging Review Ct Renal Stone Study  06/19/2014   CLINICAL DATA:  Left flank pain  EXAM: CT ABDOMEN AND PELVIS WITHOUT CONTRAST  TECHNIQUE: Multidetector CT imaging of the abdomen and pelvis was performed following the standard protocol without IV contrast.  COMPARISON:  01/31/2011  FINDINGS: Lung bases are unremarkable. Sagittal images of the spine are unremarkable.  Unenhanced liver shows no biliary ductal dilatation. No calcified gallstones are noted within gallbladder. Unenhanced pancreas, spleen and adrenal glands are unremarkable. There is nonobstructive calcified calculus in upper pole of the right kidney measures 2.4 mm. Tiny nonobstructive punctate calcification midpole of the left kidney measures 2 mm.  No hydronephrosis or hydroureter. No calcified ureteral calculi are noted.  No aortic aneurysm.  No small bowel obstruction.  No ascites or free air.  No adenopathy.  Moderate stool noted in right colon and transverse colon. No pericecal inflammation.  The  appendix is not identified. No calcified calculi are noted within urinary bladder. Small nonspecific bilateral inguinal lymph nodes are noted. Unremarkable unenhanced uterus and adnexa.  IMPRESSION: 1. Bilateral nonobstructive nephrolithiasis. No hydronephrosis or hydroureter. 2. No calcified ureteral calculi are noted. 3. Moderate stool noted in right colon and transverse colon. No pericecal inflammation. The appendix is not identified. 4. No small bowel obstruction. 5. No calcified calculi are noted within urinary bladder.   Electronically Signed   By: Natasha Mead M.D.   On: 06/19/2014 14:54     EKG Interpretation None      MDM   Final diagnoses:  Left flank pain   29 year old female with left flank pain. No obvious source is seen here, however she does have bilateral nonobstructive nephrolithiasis. Were consistent with having a kidney stone. Pain has resolved here after IV fluids and pain medicine. She is not have any evidence of  UTI. Pelvic exam revealed no evidence of uterine, fallopian, or ovarian abnormality. She does not appear to have any discharge or cervical motion tenderness. She'll begin a prescription for a few Percocet if the pain returns. She has a primary care provider and will follow-up. She is given return precautions and voices understanding.  Margarita Grizzle, MD 06/21/14 1254

## 2014-06-19 NOTE — ED Notes (Signed)
Pt states she started having left sided flank pain yesterday, pain stays in lower back, nausea, denies emesis.

## 2014-06-19 NOTE — Discharge Instructions (Signed)
Flank Pain °Flank pain is pain in your side. The flank is the area of your side between your upper belly (abdomen) and your back. Pain in this area can be caused by many different things. °HOME CARE °Home care and treatment will depend on the cause of your pain. °· Rest as told by your doctor. °· Drink enough fluids to keep your pee (urine) clear or pale yellow.   °· Only take medicine as told by your doctor. °· Tell your doctor about any changes in your pain. °· Follow up with your doctor. °GET HELP RIGHT AWAY IF:  °· Your pain does not get better with medicine.   °· You have new symptoms or your symptoms get worse. °· Your pain gets worse.   °· You have belly (abdominal) pain.   °· You are short of breath.   °· You always feel sick to your stomach (nauseous).   °· You keep throwing up (vomiting).   °· You have puffiness (swelling) in your belly.   °· You feel light-headed or you pass out (faint).   °· You have blood in your pee. °· You have a fever or lasting symptoms for more than 2-3 days. °· You have a fever and your symptoms suddenly get worse. °MAKE SURE YOU:  °· Understand these instructions. °· Will watch your condition. °· Will get help right away if you are not doing well or get worse. °Document Released: 10/17/2007 Document Revised: 05/24/2013 Document Reviewed: 08/22/2011 °ExitCare® Patient Information ©2015 [employer]. This information is not intended to replace advice given to you by your health care provider. Make sure you discuss any questions you have with your health care provider. ° °

## 2014-06-19 NOTE — ED Notes (Signed)
Pt made aware to return if symptoms worsen or if any life threatening symptoms occur.   

## 2014-06-20 LAB — HIV ANTIBODY (ROUTINE TESTING W REFLEX): HIV SCREEN 4TH GENERATION: NONREACTIVE

## 2014-06-20 LAB — RPR: RPR Ser Ql: NONREACTIVE

## 2014-09-27 ENCOUNTER — Encounter: Payer: Self-pay | Admitting: Gynecology

## 2014-09-27 ENCOUNTER — Ambulatory Visit (INDEPENDENT_AMBULATORY_CARE_PROVIDER_SITE_OTHER): Payer: Managed Care, Other (non HMO) | Admitting: Gynecology

## 2014-09-27 VITALS — BP 118/78 | Ht 64.0 in | Wt 119.0 lb

## 2014-09-27 DIAGNOSIS — N840 Polyp of corpus uteri: Secondary | ICD-10-CM | POA: Diagnosis not present

## 2014-09-27 DIAGNOSIS — N926 Irregular menstruation, unspecified: Secondary | ICD-10-CM | POA: Diagnosis not present

## 2014-09-27 DIAGNOSIS — N898 Other specified noninflammatory disorders of vagina: Secondary | ICD-10-CM

## 2014-09-27 LAB — CBC WITH DIFFERENTIAL/PLATELET
BASOS PCT: 0 % (ref 0–1)
Basophils Absolute: 0 10*3/uL (ref 0.0–0.1)
EOS ABS: 0.1 10*3/uL (ref 0.0–0.7)
Eosinophils Relative: 2 % (ref 0–5)
HCT: 41.6 % (ref 36.0–46.0)
HEMOGLOBIN: 13.8 g/dL (ref 12.0–15.0)
LYMPHS ABS: 1.8 10*3/uL (ref 0.7–4.0)
Lymphocytes Relative: 35 % (ref 12–46)
MCH: 29.3 pg (ref 26.0–34.0)
MCHC: 33.2 g/dL (ref 30.0–36.0)
MCV: 88.3 fL (ref 78.0–100.0)
MONO ABS: 0.3 10*3/uL (ref 0.1–1.0)
MONOS PCT: 6 % (ref 3–12)
MPV: 9.3 fL (ref 8.6–12.4)
NEUTROS ABS: 2.9 10*3/uL (ref 1.7–7.7)
NEUTROS PCT: 57 % (ref 43–77)
Platelets: 275 10*3/uL (ref 150–400)
RBC: 4.71 MIL/uL (ref 3.87–5.11)
RDW: 13.5 % (ref 11.5–15.5)
WBC: 5 10*3/uL (ref 4.0–10.5)

## 2014-09-27 LAB — TSH: TSH: 0.653 u[IU]/mL (ref 0.350–4.500)

## 2014-09-27 LAB — WET PREP FOR TRICH, YEAST, CLUE
Clue Cells Wet Prep HPF POC: NONE SEEN
Trich, Wet Prep: NONE SEEN
Yeast Wet Prep HPF POC: NONE SEEN

## 2014-09-27 MED ORDER — METRONIDAZOLE 500 MG PO TABS: 500.0000 mg | ORAL_TABLET | Freq: Two times a day (BID) | ORAL | Status: DC

## 2014-09-27 NOTE — Patient Instructions (Signed)
Follow up for ultrasound as scheduled.  Take the Flagyl medication twice daily for 7 days. Follow up if the vaginal discharge continues.

## 2014-09-27 NOTE — Progress Notes (Signed)
Jacqueline Stewart 02/10/1985 [id number]        29 y.o.  [id number] presents having not been seen for several years in follow up of her previously diagnosed endometrial polyp as well as irregular menses noting LMP 07/25/2014. Menses proceeding this were monthly but would vary as to when it occurred during the month. Not using consistent contraception. Without hot flashes or night sweats. No weight changes hair or skin changes. Sonohysterogram 06/2012 showed an anterior polyp 8 x 6 x 5 mm. Patient had arranged for hysteroscopy D&C but ultimately canceled it due to loss of insurance and never followed up for it until now.  Lastly patient notes vaginal discharge over the last several months. No real itching or irritation but just a constant discharge.  Past medical history,surgical history, problem list, medications, allergies, family history and social history were all reviewed and documented in the EPIC chart.  Directed ROS with pertinent positives and negatives documented in the history of present illness/assessment and plan.  Exam: Bari Mantis assistant Filed Vitals:   09/27/14 1526  BP: 118/78  Height:  (1.626 m)  Weight: 119 lb (53.978 kg)   General appearance:  Normal Abdomen soft nontender without masses guarding rebound Pelvic external BUS vagina normal. Cervix normal. Uterus normal size anteverted midline mobile nontender. Adnexa without masses or tenderness. Rectal exam is normal.  Assessment/Plan:  29 y.o. [id number] with:  1. Vaginal discharge historically. Exam did not show a significant discharge wet prep is negative. To cover her for bacterial overgrowth with Flagyl 500 mg twice a day 7 days, alcohol avoidance reviewed. Follow up if symptoms persist, worsen or recur. 2. Irregular menses.  With LMP 2 months ago. Check baseline qualitative hCG as well as TSH FSH prolactin. Options for management to include low-dose oral contraceptives which would provide contraception as well as  menstrual regulation as well as alternatives to include IUD discussed. Will rediscuss after lab work/ultrasound return. We'll schedule baseline sonohysterogram in conjunction with #3 3. History of endometrial polyp 2014. Schedule sonohysterogram to assess if still persists.    Dara Lords MD, 4:27 PM 09/27/2014

## 2014-09-28 LAB — PROLACTIN: Prolactin: 5.8 ng/mL

## 2014-09-28 LAB — HCG, SERUM, QUALITATIVE: Preg, Serum: NEGATIVE

## 2014-09-28 LAB — FOLLICLE STIMULATING HORMONE: FSH: 7.6 m[IU]/mL

## 2014-09-29 ENCOUNTER — Telehealth: Payer: Self-pay | Admitting: Gynecology

## 2014-09-29 NOTE — Telephone Encounter (Signed)
09/29/14-Pt was advised today that her [place] ins covers the sonohysterogram with 20% coins as she has met her deductible but not her oop cost of $5950.00(4590.36 is met). Allowable is $772.33 so her cost will be $154.47. If bx needed an additional $50.96 (20% of 254.77). She will pay $75 date of service and be billed for the rest.wl

## 2014-09-30 ENCOUNTER — Other Ambulatory Visit: Payer: Self-pay | Admitting: Gynecology

## 2014-09-30 DIAGNOSIS — N939 Abnormal uterine and vaginal bleeding, unspecified: Secondary | ICD-10-CM

## 2014-09-30 DIAGNOSIS — N840 Polyp of corpus uteri: Secondary | ICD-10-CM

## 2014-10-05 ENCOUNTER — Ambulatory Visit (INDEPENDENT_AMBULATORY_CARE_PROVIDER_SITE_OTHER): Payer: Managed Care, Other (non HMO)

## 2014-10-05 ENCOUNTER — Other Ambulatory Visit (HOSPITAL_COMMUNITY)
Admission: RE | Admit: 2014-10-05 | Discharge: 2014-10-05 | Disposition: A | Discharge status: Home / Self Care | Payer: Managed Care, Other (non HMO) | Source: Ambulatory Visit | Attending: Gynecology | Admitting: Gynecology

## 2014-10-05 ENCOUNTER — Encounter: Payer: Self-pay | Admitting: Gynecology

## 2014-10-05 ENCOUNTER — Ambulatory Visit (INDEPENDENT_AMBULATORY_CARE_PROVIDER_SITE_OTHER): Payer: Managed Care, Other (non HMO) | Admitting: Gynecology

## 2014-10-05 VITALS — BP 118/74

## 2014-10-05 DIAGNOSIS — N840 Polyp of corpus uteri: Secondary | ICD-10-CM | POA: Diagnosis not present

## 2014-10-05 DIAGNOSIS — N939 Abnormal uterine and vaginal bleeding, unspecified: Secondary | ICD-10-CM | POA: Diagnosis not present

## 2014-10-05 DIAGNOSIS — Z1151 Encounter for screening for human papillomavirus (HPV): Secondary | ICD-10-CM | POA: Insufficient documentation

## 2014-10-05 DIAGNOSIS — Z01419 Encounter for gynecological examination (general) (routine) without abnormal findings: Secondary | ICD-10-CM | POA: Diagnosis present

## 2014-10-05 DIAGNOSIS — N926 Irregular menstruation, unspecified: Secondary | ICD-10-CM

## 2014-10-05 DIAGNOSIS — Z124 Encounter for screening for malignant neoplasm of cervix: Secondary | ICD-10-CM

## 2014-10-05 MED ORDER — NORETHIN ACE-ETH ESTRAD-FE 1-20 MG-MCG PO TABS: 1.0000 | ORAL_TABLET | Freq: Every day | ORAL | Status: DC

## 2014-10-05 NOTE — Patient Instructions (Signed)
Office will call you with biopsy results. Follow up if irregular menses continue after starting the birth control pills. Follow up in one year for annual exam

## 2014-10-05 NOTE — Progress Notes (Addendum)
Jacqueline Stewart 1985-10-14 [id number]        29 y.o.  [id number] presents for sonohysterogram due to history of irregular bleeding noting LMP 07/25/2014. Menses proceeding this were monthly but with various to when they started during the month. No prolonged or atypical bleeding. Recent lab work to include hCG TSH FSH prolactin were all normal. Does have a history of senna hysterogram in 2014 showing a polyp 8 x 6 x 5 mm. Never followed up for hysteroscopy as was planned.  Past medical history,surgical history, problem list, medications, allergies, family history and social history were all reviewed and documented in the EPIC chart.  Directed ROS with pertinent positives and negatives documented in the history of present illness/assessment and plan.  Exam: [place]/Kim assistant Filed Vitals:   10/05/14 1534  BP: 118/74   General appearance:  Normal HEENT normal Skin grossly normal Lungs clear bilaterally Cardiac regular rate no rubs murmurs or gallops Abdomen soft nontender without masses guarding rebound organomegaly Breasts examined lying and sitting without masses retractions discharge adenopathy Pelvic external BUS vagina normal. Cervix normal. Uterus axial normal size midline mobile nontender. Adnexa without masses or tenderness  Ultrasound shows uterus grossly normal to generous in size with homogeneous echotexture. Endometrial echo 11 mm. Right and left ovaries grossly normal with physiologic changes. Cul-de-sac negative.  Sonohysterogram performed, sterile technique, easy catheter introduction, good distention with no abnormalities. Endometrial biopsy taken.  Patient tolerated well  Assessment/Plan:  29 y.o. [id number] with irregular menses and normal hormone panel. History of endometrial polyp now spontaneously resolved. Options for management reviewed to include hormonal manipulation/trial of Mirena IUD. We both ultimately decided on trial of low-dose oral contraceptives with a  Loestrin 1/20 equivalent. Every other month withdrawal option reviewed. We'll go ahead and Sunday start with her next menses. Follow up if irregular bleeding continues. I did a complete exam today as she is losing her insurance and had one scheduled next month  As out of pocket cost.  She reports routine blood work done through Dr. Johnathan Hausen office. Assuming she does well starting the birth control pills then she will follow up in one year, sooner if irregularity continues.    Dara Lords MD, 4:11 PM 10/05/2014

## 2014-10-06 ENCOUNTER — Other Ambulatory Visit: Payer: Self-pay | Admitting: Gynecology

## 2014-10-10 LAB — CYTOLOGY - PAP

## 2014-10-31 ENCOUNTER — Encounter: Payer: Self-pay | Admitting: Gynecology

## 2014-12-22 ENCOUNTER — Emergency Department (HOSPITAL_COMMUNITY)
Admission: EM | Admit: 2014-12-22 | Discharge: 2014-12-22 | Disposition: A | Discharge status: Home / Self Care | Payer: No Typology Code available for payment source | Attending: Emergency Medicine | Admitting: Emergency Medicine

## 2014-12-22 ENCOUNTER — Encounter (HOSPITAL_COMMUNITY): Payer: Self-pay | Admitting: Cardiology

## 2014-12-22 ENCOUNTER — Emergency Department (HOSPITAL_COMMUNITY): Payer: No Typology Code available for payment source

## 2014-12-22 DIAGNOSIS — Z8742 Personal history of other diseases of the female genital tract: Secondary | ICD-10-CM | POA: Insufficient documentation

## 2014-12-22 DIAGNOSIS — Y998 Other external cause status: Secondary | ICD-10-CM | POA: Diagnosis not present

## 2014-12-22 DIAGNOSIS — Y9389 Activity, other specified: Secondary | ICD-10-CM | POA: Diagnosis not present

## 2014-12-22 DIAGNOSIS — Z8719 Personal history of other diseases of the digestive system: Secondary | ICD-10-CM | POA: Insufficient documentation

## 2014-12-22 DIAGNOSIS — Z791 Long term (current) use of non-steroidal anti-inflammatories (NSAID): Secondary | ICD-10-CM | POA: Diagnosis not present

## 2014-12-22 DIAGNOSIS — Z87891 Personal history of nicotine dependence: Secondary | ICD-10-CM | POA: Diagnosis not present

## 2014-12-22 DIAGNOSIS — S63602A Unspecified sprain of left thumb, initial encounter: Secondary | ICD-10-CM | POA: Diagnosis not present

## 2014-12-22 DIAGNOSIS — Z792 Long term (current) use of antibiotics: Secondary | ICD-10-CM | POA: Diagnosis not present

## 2014-12-22 DIAGNOSIS — S6992XA Unspecified injury of left wrist, hand and finger(s), initial encounter: Secondary | ICD-10-CM | POA: Diagnosis present

## 2014-12-22 DIAGNOSIS — G43909 Migraine, unspecified, not intractable, without status migrainosus: Secondary | ICD-10-CM | POA: Insufficient documentation

## 2014-12-22 DIAGNOSIS — Z8619 Personal history of other infectious and parasitic diseases: Secondary | ICD-10-CM | POA: Insufficient documentation

## 2014-12-22 DIAGNOSIS — Y92410 Unspecified street and highway as the place of occurrence of the external cause: Secondary | ICD-10-CM | POA: Insufficient documentation

## 2014-12-22 MED ORDER — IBUPROFEN 800 MG PO TABS
800.0000 mg | ORAL_TABLET | Freq: Once | ORAL | Status: AC
  Administered 2014-12-22: 800 mg via ORAL
  Filled 2014-12-22: qty 1

## 2014-12-22 MED ORDER — IBUPROFEN 800 MG PO TABS: 800.0000 mg | ORAL_TABLET | Freq: Three times a day (TID) | ORAL | Status: DC
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
## 2014-12-22 NOTE — ED Notes (Signed)
Patient with no complaints at this time. Respirations even and unlabored. Skin warm/dry. Discharge instructions reviewed with patient at this time. Patient given opportunity to voice concerns/ask questions. Patient discharged at this time and left Emergency Department with steady gait.   

## 2016-06-05 ENCOUNTER — Encounter: Payer: Self-pay | Admitting: Gynecology

## 2017-01-07 ENCOUNTER — Encounter (HOSPITAL_COMMUNITY): Payer: Self-pay

## 2017-01-07 ENCOUNTER — Emergency Department (HOSPITAL_COMMUNITY): Payer: PRIVATE HEALTH INSURANCE

## 2017-01-07 ENCOUNTER — Emergency Department (HOSPITAL_COMMUNITY)
Admission: EM | Admit: 2017-01-07 | Discharge: 2017-01-07 | Disposition: A | Discharge status: Home / Self Care | Payer: PRIVATE HEALTH INSURANCE | Attending: Emergency Medicine | Admitting: Emergency Medicine

## 2017-01-07 DIAGNOSIS — F17200 Nicotine dependence, unspecified, uncomplicated: Secondary | ICD-10-CM | POA: Insufficient documentation

## 2017-01-07 DIAGNOSIS — Z79899 Other long term (current) drug therapy: Secondary | ICD-10-CM | POA: Diagnosis not present

## 2017-01-07 DIAGNOSIS — R05 Cough: Secondary | ICD-10-CM | POA: Diagnosis present

## 2017-01-07 DIAGNOSIS — J069 Acute upper respiratory infection, unspecified: Secondary | ICD-10-CM | POA: Diagnosis not present

## 2017-01-07 DIAGNOSIS — B349 Viral infection, unspecified: Secondary | ICD-10-CM | POA: Diagnosis not present

## 2017-01-07 MED ORDER — BENZONATATE 200 MG PO CAPS: 200.0000 mg | ORAL_CAPSULE | Freq: Three times a day (TID) | ORAL | 0 refills | Status: AC | PRN

## 2017-01-07 MED ORDER — IBUPROFEN 600 MG PO TABS
600.0000 mg | ORAL_TABLET | Freq: Four times a day (QID) | ORAL | 0 refills | Status: AC | PRN
Start: 2017-01-07 — End: 2017-01-14

## 2017-01-07 MED ORDER — FLUTICASONE PROPIONATE 50 MCG/ACT NA SUSP: 1.0000 | Freq: Every day | NASAL | 2 refills | Status: DC

## 2017-01-07 NOTE — Discharge Instructions (Signed)
Please read the attached information regarding your condition. Use symptomatic treatment with ibuprofen, Tessalon Perles as needed for cough and Flonase as needed for nasal congestion. Push fluids to maintain adequate hydration. Follow-up with your primary care provider for further evaluation. Return to ED for worsening cough, chest pain, shortness of breath, wheezing.

## 2017-01-07 NOTE — ED Provider Notes (Signed)
[facility] EMERGENCY DEPARTMENT Provider Note   CSN: [id number] Arrival date & time: 01/07/17  1222     History   Chief Complaint Chief Complaint  Patient presents with  . Cough    HPI Jacqueline Stewart is a 31 y.o. female who presents to ED for evaluation of one-week history of productive cough, nasal congestion.  She has tried over-the-counter Mucinex with no relief in her symptoms.  States that the cough is so bad that it is keeping her up at night."  She also complains of left upper arm pain for the past 2 weeks.  States that she had similar pain a few months ago related to her job.  She does lift and push heavy objects and thought that it was due to this.  She is not taking any medications to help with the symptoms.  She denies any previous fracture, dislocation or procedures in the area.  She denies any chest pain, shortness of breath, wheezing, ear pain, fevers, vomiting. Did not receive influenza vaccine this year.  HPI  Past Medical History:  Diagnosis Date  . 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 Condyloma acuminata 01/2011   biopsy removed  . Constipation   . 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 Migraine   . Uterine polyp     Patient Active Problem List   Diagnosis Date Noted  . Migraine   . CIN II (cervical intraepithelial neoplasia II)     Past Surgical History:  Procedure Laterality Date  . CESAREAN SECTION    . COLPOSCOPY      OB History    Gravida Para Term Preterm AB Living   2 2 2     2    SAB TAB Ectopic Multiple Live Births                   Home Medications    Prior to Admission medications   Medication Sig Start Date End Date Taking? Authorizing Provider  benzonatate (TESSALON) 200 MG capsule Take 1 capsule (200 mg total) by mouth 3 (three) times daily as needed for up to 7 days for cough. 01/07/17 01/14/17  Lylianna Fraiser, PA-C  fluticasone (FLONASE) 50 MCG/ACT nasal  spray Place 1 spray into both nostrils daily. 01/07/17   Tristy Udovich, PA-C  ibuprofen (ADVIL,MOTRIN) 600 MG tablet Take 1 tablet (600 mg total) by mouth every 6 (six) hours as needed for up to 7 days. 01/07/17 01/14/17  Chevy Sweigert, PA-C  metroNIDAZOLE (FLAGYL) 500 MG tablet Take 500 mg by mouth 3 (three) times daily.    [provider]  norethindrone-ethinyl estradiol (MICROGESTIN FE 1/20) 1-20 MG-MCG tablet Take 1 tablet by mouth daily. 10/05/14   Fontaine, Nadyne Coombes, MD  Rizatriptan Benzoate (MAXALT PO) Take 1 tablet by mouth as needed (migraines).     [provider]  topiramate (TOPAMAX) 100 MG tablet Take 100 mg by mouth at bedtime.    [provider]    Family History Family History  Problem Relation Age of Onset  . Thyroid disease Mother   . Diabetes Maternal Uncle   . Diabetes Maternal Grandmother   . Hypertension Maternal Grandmother   . Heart disease Maternal Grandmother   . Heart disease Paternal Grandfather   . Cancer Paternal Grandfather        prostate    Social History Social History   Tobacco Use  . Smoking status: Current Every Day  Smoker    Last attempt to quit: 08/05/2010    Years since quitting: 6.4  . Smokeless tobacco: Never Used  Substance Use Topics  . Alcohol use: Yes    Comment: rare  . Drug use: No     Allergies   Doxycycline   Review of Systems Review of Systems  Constitutional: Negative for chills and fever.  HENT: Positive for congestion and rhinorrhea. Negative for drooling, ear discharge, ear pain, facial swelling, postnasal drip, sinus pressure, sinus pain, sore throat and trouble swallowing.   Respiratory: Positive for cough. Negative for shortness of breath.   Cardiovascular: Negative for chest pain.  Gastrointestinal: Negative for nausea and vomiting.  Musculoskeletal: Positive for myalgias.  Neurological: Negative for headaches.     Physical Exam Updated Vital Signs BP 116/82 (BP Location: Right  Arm)   Pulse 69   Temp 98.4 F (36.9 C) (Oral)   Resp (!) 97   Ht 5\' 4"  (1.626 m)   Wt 66.7 kg (147 lb)   LMP 11/20/2016 (Approximate) Comment: reports are irregular  SpO2 98%   BMI 25.23 kg/m   Physical Exam  Constitutional: She appears well-developed and well-nourished. No distress.  HENT:  Head: Normocephalic and atraumatic.  Right Ear: A middle ear effusion is present.  Left Ear: A middle ear effusion is present.  Nose: Rhinorrhea present.  Mouth/Throat: Uvula is midline. Posterior oropharyngeal erythema present. No tonsillar exudate.  Eyes: Conjunctivae and EOM are normal. No scleral icterus.  Neck: Normal range of motion.  Cardiovascular: Normal rate, regular rhythm and normal heart sounds.  Pulmonary/Chest: Effort normal and breath sounds normal. No respiratory distress. She has no wheezes.  Musculoskeletal: Normal range of motion. She exhibits tenderness. She exhibits no edema or deformity.       Arms: Tenderness to palpation of the indicated area of the left arm.  No chest tenderness to palpation.  Full active and passive range of motion of bilateral shoulders.  5 out of 5 symmetrical strength noted in bilateral upper extremities.  Neurological: She is alert.  Skin: No rash noted. She is not diaphoretic.  Psychiatric: She has a normal mood and affect.  Nursing note and vitals reviewed.    ED Treatments / Results  Labs (all labs ordered are listed, but only abnormal results are displayed) Labs Reviewed - No data to display  EKG  EKG Interpretation None       Radiology Dg Chest 2 View  Result Date: 01/07/2017 CLINICAL DATA:  Productive cough for the past 10 days associated with shortness of breath and chest pain. Current smoker. EXAM: CHEST  2 VIEW COMPARISON:  Chest x-ray of April 21, 2009 FINDINGS: The lungs are mildly hyperinflated and clear. The heart and pulmonary vascularity are normal. The mediastinum is normal in width. There is no pleural effusion. The  bony thorax is unremarkable. IMPRESSION: Mild hyperinflation may be voluntary or may reflect the patient's smoking history. There is no acute cardiopulmonary abnormality. Electronically Signed   By: David  SwazilandJordan M.D.   On: 01/07/2017 13:33    Procedures Procedures (including critical care time)  Medications Ordered in ED Medications - No data to display   Initial Impression / Assessment and Plan / ED Course  I have reviewed the triage vital signs and the nursing notes.  Pertinent labs & imaging results that were available during my care of the patient were reviewed by me and considered in my medical decision making (see chart for details).     Patient  presents to ED for evaluation of cough, nasal congestion for the past week.  She also reports a left upper arm pain for the past 2 weeks which she attributes to her job which involves heavy lifting and pushing.  She denies any chest pain, shortness of breath, prior cardiac or pulmonary issues.  Here she is satting at 99% on room air.  She is afebrile with no history of fever.  She is overall well-appearing although a dry cough is noticed during my examination.  Chest x-ray returned as negative for acute abnormality.  I suspect a viral cause of her illness.  Will give symptomatic treatment with Tessalon Perles, Flonase and ibuprofen to be taken.  I suspect that her left arm pain is due to muscle soreness from the physical activity she does at work rather than referred pain from a cardiac cause based on the absence of chest pain or other symptoms.  Advised to follow-up with primary care provider for further evaluation.  Patient appears stable for discharge at this time.  Strict return precautions given.  Final Clinical Impressions(s) / ED Diagnoses   Final diagnoses:  Viral upper respiratory tract infection    ED Discharge Orders        Ordered    ibuprofen (ADVIL,MOTRIN) 600 MG tablet  Every 6 hours PRN     01/07/17 1349    benzonatate  (TESSALON) 200 MG capsule  3 times daily PRN     01/07/17 1349    fluticasone (FLONASE) 50 MCG/ACT nasal spray  Daily     01/07/17 1349     Portions of this note were generated with Dragon dictation software. Dictation errors may occur despite best attempts at proofreading.    Dietrich PatesKhatri, Kevin Mario, PA-C 01/07/17 1352    Benjiman CorePickering, Nathan, MD 01/07/17 [phone]

## 2017-01-07 NOTE — ED Triage Notes (Signed)
Pt reports cough, congestion and pain in left arm x 1 1/2 weeks.  Says had the same symptoms a while back but they went away.

## 2017-05-20 ENCOUNTER — Other Ambulatory Visit: Payer: Self-pay

## 2017-05-20 ENCOUNTER — Emergency Department (HOSPITAL_COMMUNITY)
Admission: EM | Admit: 2017-05-20 | Discharge: 2017-05-20 | Disposition: A | Discharge status: Home / Self Care | Payer: PRIVATE HEALTH INSURANCE | Attending: Emergency Medicine | Admitting: Emergency Medicine

## 2017-05-20 ENCOUNTER — Encounter (HOSPITAL_COMMUNITY): Payer: Self-pay | Admitting: Emergency Medicine

## 2017-05-20 DIAGNOSIS — Z79899 Other long term (current) drug therapy: Secondary | ICD-10-CM | POA: Insufficient documentation

## 2017-05-20 DIAGNOSIS — R197 Diarrhea, unspecified: Secondary | ICD-10-CM

## 2017-05-20 DIAGNOSIS — F17210 Nicotine dependence, cigarettes, uncomplicated: Secondary | ICD-10-CM | POA: Insufficient documentation

## 2017-05-20 DIAGNOSIS — K922 Gastrointestinal hemorrhage, unspecified: Secondary | ICD-10-CM | POA: Insufficient documentation

## 2017-05-20 LAB — BASIC METABOLIC PANEL
ANION GAP: 10 (ref 5–15)
BUN: 12 mg/dL (ref 6–20)
CHLORIDE: 102 mmol/L (ref 101–111)
CO2: 26 mmol/L (ref 22–32)
Calcium: 9.1 mg/dL (ref 8.9–10.3)
Creatinine, Ser: 0.9 mg/dL (ref 0.44–1.00)
GFR calc Af Amer: >60 mL/min (ref >60)
GFR calc non Af Amer: >60 mL/min (ref >60)
GLUCOSE: 88 mg/dL (ref 65–99)
POTASSIUM: 4.4 mmol/L (ref 3.5–5.1)
Sodium: 138 mmol/L (ref 135–145)

## 2017-05-20 LAB — URINALYSIS, ROUTINE W REFLEX MICROSCOPIC
Bilirubin Urine: NEGATIVE
GLUCOSE, UA: NEGATIVE mg/dL
Hgb urine dipstick: NEGATIVE
Ketones, ur: NEGATIVE mg/dL
LEUKOCYTES UA: NEGATIVE
Nitrite: NEGATIVE
PH: 7 (ref 5.0–8.0)
Protein, ur: NEGATIVE mg/dL
SPECIFIC GRAVITY, URINE: 1.015 (ref 1.005–1.030)

## 2017-05-20 LAB — PREGNANCY, URINE: Preg Test, Ur: NEGATIVE

## 2017-05-20 LAB — CBC WITH DIFFERENTIAL/PLATELET
BASOS ABS: 0 10*3/uL (ref 0.0–0.1)
Basophils Relative: 0 %
Eosinophils Absolute: 0.2 10*3/uL (ref 0.0–0.7)
Eosinophils Relative: 3 %
HEMATOCRIT: 44.6 % (ref 36.0–46.0)
HEMOGLOBIN: 14.9 g/dL (ref 12.0–15.0)
LYMPHS PCT: 30 %
Lymphs Abs: 1.8 10*3/uL (ref 0.7–4.0)
MCH: 30 pg (ref 26.0–34.0)
MCHC: 33.4 g/dL (ref 30.0–36.0)
MCV: 89.7 fL (ref 78.0–100.0)
MONO ABS: 0.3 10*3/uL (ref 0.1–1.0)
Monocytes Relative: 5 %
NEUTROS ABS: 3.6 10*3/uL (ref 1.7–7.7)
Neutrophils Relative %: 62 %
Platelets: 249 10*3/uL (ref 150–400)
RBC: 4.97 MIL/uL (ref 3.87–5.11)
RDW: 13.4 % (ref 11.5–15.5)
WBC: 5.9 10*3/uL (ref 4.0–10.5)

## 2017-05-20 LAB — POC OCCULT BLOOD, ED: Fecal Occult Bld: NEGATIVE

## 2017-05-20 MED ORDER — DIPHENOXYLATE-ATROPINE 2.5-0.025 MG PO TABS: 1.0000 | ORAL_TABLET | Freq: Four times a day (QID) | ORAL | 0 refills | Status: AC | PRN

## 2017-05-20 NOTE — Discharge Instructions (Addendum)
Lomotil for diarrhea as needed. Contact Dr. Darrick Penna, gastroenterology, for outpatient appointment

## 2017-05-20 NOTE — ED Notes (Signed)
Pt c/o intermittent rectal bleeding with bowel movements for the past month or so,

## 2017-05-20 NOTE — ED Triage Notes (Addendum)
Pt c/o bright red blood in stools intermittently x 3-4 months with generalized weakness. States she only has abdominal/back pain when she is having a bowel movement.  Reports diarrhea x 2 days.

## 2017-05-24 NOTE — ED Provider Notes (Signed)
[facility] EMERGENCY DEPARTMENT Provider Note   CSN: [id number] Arrival date & time: 05/20/17  1005     History   Chief Complaint Chief Complaint  Patient presents with  . Rectal Bleeding    HPI ABIGAYL HOR is a 32 y.o. female.  Complaint is diarrhea, bloody stools.  HPI 32 year old female.  Intermittently will have bright red blood in her stool.  Primarily with bowel movement or diarrhea.  Has had diarrhea for last 2 days and states her "traces" of blood.  No frank bright red blood.  Generalized weakness.  Not lightheaded, dizzy, syncopal, orthostatic.  No abdominal pain.  No mucus.  No vomiting.  Had GI evaluation or colonoscopy.  No history of irritable bowel or inflammatory bowel disorder.  No travel.  Recent antibiotic or steroid use.  Past Medical History:  Diagnosis Date  . 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 Condyloma acuminata 01/2011   biopsy removed  . Constipation   . 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 Migraine   . Uterine polyp     Patient Active Problem List   Diagnosis Date Noted  . Migraine   . CIN II (cervical intraepithelial neoplasia II)     Past Surgical History:  Procedure Laterality Date  . CESAREAN SECTION    . COLPOSCOPY       OB History    Gravida  2   Para  2   Term  2   Preterm      AB      Living  2     SAB      TAB      Ectopic      Multiple      Live Births               Home Medications    Prior to Admission medications   Medication Sig Start Date End Date Taking? Authorizing Provider  citalopram (CELEXA) 20 MG tablet Take 1 tablet by mouth daily. 04/27/17  Yes [provider]  clonazePAM (KLONOPIN) 1 MG tablet Take 1 mg by mouth at bedtime.  04/27/17  Yes [provider]  Rizatriptan Benzoate (MAXALT PO) Take 1 tablet by mouth as needed (migraines).    Yes [provider]  diphenoxylate-atropine  (LOMOTIL) 2.5-0.025 MG tablet Take 1 tablet by mouth 4 (four) times daily as needed for diarrhea or loose stools. 05/20/17   Rolland Porter, MD    Family History Family History  Problem Relation Age of Onset  . Thyroid disease Mother   . Diabetes Maternal Uncle   . Diabetes Maternal Grandmother   . Hypertension Maternal Grandmother   . Heart disease Maternal Grandmother   . Heart disease Paternal Grandfather   . Cancer Paternal Grandfather        prostate    Social History Social History   Tobacco Use  . Smoking status: Current Every Day Smoker    Packs/day: 0.50    Types: Cigarettes  . Smokeless tobacco: Never Used  Substance Use Topics  . Alcohol use: Yes    Comment: rare  . Drug use: No     Allergies   Doxycycline   Review of Systems Review of Systems  Constitutional: Negative for appetite change, chills, diaphoresis, fatigue and fever.  HENT: Negative for mouth sores, sore throat and trouble swallowing.   Eyes: Negative for visual disturbance.  Respiratory: Negative for cough, chest tightness, shortness  of breath and wheezing.   Cardiovascular: Negative for chest pain.  Gastrointestinal: Positive for abdominal pain and blood in stool. Negative for abdominal distention, diarrhea, nausea and vomiting.  Endocrine: Negative for polydipsia, polyphagia and polyuria.  Genitourinary: Negative for dysuria, frequency and hematuria.  Musculoskeletal: Negative for gait problem.  Skin: Negative for color change, pallor and rash.  Neurological: Negative for dizziness, syncope, light-headedness and headaches.  Hematological: Does not bruise/bleed easily.  Psychiatric/Behavioral: Negative for behavioral problems and confusion.     Physical Exam Updated Vital Signs BP 109/77 (BP Location: Right Arm)   Pulse 72   Temp 98.3 F (36.8 C) (Oral)   Resp 14   Ht  (1.575 m)   Wt 69.4 kg (153 lb)   LMP 03/30/2017 (Approximate)   SpO2 99%   BMI 27.98 kg/m   Physical Exam    Constitutional: She is oriented to person, place, and time. She appears well-developed and well-nourished. No distress.  HENT:  Head: Normocephalic.  Eyes: Pupils are equal, round, and reactive to light. Conjunctivae are normal. No scleral icterus.  No scleral icterus.  Conjunctive a not pale.  Neck: Normal range of motion. Neck supple. No thyromegaly present.  Cardiovascular: Normal rate and regular rhythm. Exam reveals no gallop and no friction rub.  No murmur heard. Pulmonary/Chest: Effort normal and breath sounds normal. No respiratory distress. She has no wheezes. She has no rales.  Abdominal: Soft. Bowel sounds are normal. She exhibits no distension. There is no tenderness. There is no rebound.  Abdomen soft.  No guarding rebound or peritoneal irritation.  Genitourinary:  Genitourinary Comments: No fissure, no hemorrhoids, no blood on rectal exam.  Guaiac negative.  Musculoskeletal: Normal range of motion.  Neurological: She is alert and oriented to person, place, and time.  Skin: Skin is warm and dry. No rash noted.  Psychiatric: She has a normal mood and affect. Her behavior is normal.     ED Treatments / Results  Labs (all labs ordered are listed, but only abnormal results are displayed) Labs Reviewed  CBC WITH DIFFERENTIAL/PLATELET  BASIC METABOLIC PANEL  URINALYSIS, ROUTINE W REFLEX MICROSCOPIC  PREGNANCY, URINE  POC OCCULT BLOOD, ED    EKG None  Radiology No results found.  Procedures Procedures (including critical care time)  Medications Ordered in ED Medications - No data to display   Initial Impression / Assessment and Plan / ED Course  I have reviewed the triage vital signs and the nursing notes.  Pertinent labs & imaging results that were available during my care of the patient were reviewed by me and considered in my medical decision making (see chart for details).   Normal hemoglobin.  Guaiac negative.  Stable vitals.  Plan GI referral.   Appropriate for home treatment.   Final Clinical Impressions(s) / ED Diagnoses   Final diagnoses:  Diarrhea, unspecified type  Gastrointestinal hemorrhage, unspecified gastrointestinal hemorrhage type    ED Discharge Orders        Ordered    diphenoxylate-atropine (LOMOTIL) 2.5-0.025 MG tablet  4 times daily PRN     05/20/17 1457       Rolland Porter, MD 05/24/17 [phone]

## 2017-06-02 ENCOUNTER — Emergency Department (HOSPITAL_COMMUNITY)
Admission: EM | Admit: 2017-06-02 | Discharge: 2017-06-02 | Disposition: A | Discharge status: Home / Self Care | Payer: Self-pay | Attending: Emergency Medicine | Admitting: Emergency Medicine

## 2017-06-02 ENCOUNTER — Encounter (HOSPITAL_COMMUNITY): Payer: Self-pay

## 2017-06-02 ENCOUNTER — Other Ambulatory Visit: Payer: Self-pay

## 2017-06-02 ENCOUNTER — Emergency Department (HOSPITAL_COMMUNITY): Payer: Self-pay

## 2017-06-02 DIAGNOSIS — F17210 Nicotine dependence, cigarettes, uncomplicated: Secondary | ICD-10-CM | POA: Insufficient documentation

## 2017-06-02 DIAGNOSIS — Z79899 Other long term (current) drug therapy: Secondary | ICD-10-CM | POA: Insufficient documentation

## 2017-06-02 DIAGNOSIS — M5442 Lumbago with sciatica, left side: Secondary | ICD-10-CM | POA: Insufficient documentation

## 2017-06-02 LAB — URINALYSIS, ROUTINE W REFLEX MICROSCOPIC
Bilirubin Urine: NEGATIVE
GLUCOSE, UA: NEGATIVE mg/dL
Hgb urine dipstick: NEGATIVE
Ketones, ur: NEGATIVE mg/dL
LEUKOCYTES UA: NEGATIVE
Nitrite: NEGATIVE
PROTEIN: NEGATIVE mg/dL
Specific Gravity, Urine: 1.018 (ref 1.005–1.030)
pH: 5 (ref 5.0–8.0)

## 2017-06-02 LAB — PREGNANCY, URINE: Preg Test, Ur: NEGATIVE

## 2017-06-02 MED ORDER — DEXAMETHASONE SODIUM PHOSPHATE 10 MG/ML IJ SOLN
10.0000 mg | Freq: Once | INTRAMUSCULAR | Status: AC
  Administered 2017-06-02: 10 mg via INTRAMUSCULAR
  Filled 2017-06-02: qty 1

## 2017-06-02 MED ORDER — METHOCARBAMOL 500 MG PO TABS: 500.0000 mg | ORAL_TABLET | Freq: Two times a day (BID) | ORAL | 0 refills | Status: DC

## 2017-06-02 MED ORDER — METHYLPREDNISOLONE 4 MG PO TBPK: ORAL_TABLET | ORAL | 0 refills | Status: DC

## 2017-06-02 MED ORDER — KETOROLAC TROMETHAMINE 30 MG/ML IJ SOLN
30.0000 mg | Freq: Once | INTRAMUSCULAR | Status: AC
  Administered 2017-06-02: 30 mg via INTRAMUSCULAR
  Filled 2017-06-02: qty 1

## 2017-06-02 MED ORDER — METHOCARBAMOL 500 MG PO TABS
1000.0000 mg | ORAL_TABLET | Freq: Once | ORAL | Status: AC
  Administered 2017-06-02: 1000 mg via ORAL
  Filled 2017-06-02: qty 2

## 2017-06-02 NOTE — ED Provider Notes (Signed)
[facility] EMERGENCY DEPARTMENT Provider Note   CSN: [id number] Arrival date & time: 06/02/17  1003     History   Chief Complaint Chief Complaint  Patient presents with  . Back Pain    HPI Jacqueline Stewart is a 32 y.o. female.  Jacqueline Stewart is a 32 y.o. Female with a hx of constipation and migraines, presents to the ED for evalaution of back pain.  Patient reports sudden onset of back pain 2 days ago she was starting to vacuum her floors.  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  She reports she had just started to vacuum when she had intense pain on the left side of her lower back and felt like it was in a knot, she reports since then she had pain in the left lower back there is a constant ache that occasionally radiates down her left leg.  Patient denies any pain in the right leg.  No numbness or weakness.  Pain is made worse with range of motion, forward bending or heavy lifting, or palpation.  She has tried ibuprofen as well as some old muscle relaxer she had but has not gotten any relief, has not tried anything else.  No other aggravating or alleviating factors.  Patient denies any associated fevers or chills no abdominal pain, nausea or vomiting, no constipation, urinary retention or loss of bowel or bladder control.  Patient does report some occasional urinary frequency but denies any dysuria, flank pain or hematuria.  No personal history of IV drug use or cancer.  No previous history of back injury     Past Medical History:  Diagnosis Date  . 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 Condyloma acuminata 01/2011   biopsy removed  . Constipation   . 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 Migraine   . Uterine polyp     Patient Active Problem List   Diagnosis Date  Noted  . Migraine   . CIN II (cervical intraepithelial neoplasia II)     Past Surgical History:  Procedure Laterality Date  . CESAREAN SECTION    . COLPOSCOPY       OB History    Gravida  2   Para  2   Term  2   Preterm      AB      Living  2     SAB      TAB      Ectopic      Multiple      Live Births               Home Medications    Prior to Admission medications   Medication Sig Start Date End Date Taking? Authorizing Provider  citalopram (CELEXA) 20 MG tablet Take 1 tablet by mouth daily. 04/27/17   [provider]  clonazePAM (KLONOPIN) 1 MG tablet Take 1 mg by mouth at bedtime.  04/27/17   [provider]  diphenoxylate-atropine (LOMOTIL) 2.5-0.025 MG tablet Take 1 tablet by mouth 4 (four) times daily as needed for diarrhea or loose stools. 05/20/17   Rolland Porter, MD  methocarbamol (ROBAXIN) 500 MG tablet Take 1 tablet (500 mg total) by mouth 2 (two) times daily. 06/02/17   Dartha Lodge, PA-C  methylPREDNISolone (MEDROL DOSEPAK) 4 MG TBPK tablet  Take as directed 06/02/17   Dartha Lodge, PA-C  Rizatriptan Benzoate (MAXALT PO) Take 1 tablet by mouth as needed (migraines).     [provider]    Family History Family History  Problem Relation Age of Onset  . Thyroid disease Mother   . Diabetes Maternal Uncle   . Diabetes Maternal Grandmother   . Hypertension Maternal Grandmother   . Heart disease Maternal Grandmother   . Heart disease Paternal Grandfather   . Cancer Paternal Grandfather        prostate    Social History Social History   Tobacco Use  . Smoking status: Current Every Day Smoker    Packs/day: 0.50    Types: Cigarettes  . Smokeless tobacco: Never Used  Substance Use Topics  . Alcohol use: Yes    Comment: rare  . Drug use: No     Allergies   Doxycycline   Review of Systems Review of Systems  Constitutional: Negative for chills and fever.  Respiratory: Negative for shortness of breath.     Cardiovascular: Negative for chest pain.  Gastrointestinal: Negative for abdominal pain, blood in stool, constipation, diarrhea, nausea and vomiting.  Genitourinary: Positive for frequency. Negative for difficulty urinating, dysuria, flank pain, hematuria, pelvic pain, vaginal bleeding and vaginal discharge.  Musculoskeletal: Positive for back pain. Negative for arthralgias, gait problem, joint swelling, myalgias and neck pain.  Skin: Negative for color change and rash.  Neurological: Negative for dizziness, weakness, numbness and headaches.     Physical Exam Updated Vital Signs BP 120/74 (BP Location: Right Arm)   Pulse (!) 57   Resp 16   LMP 05/16/2017   SpO2 100%   Physical Exam  Constitutional: She is oriented to person, place, and time. She appears well-developed and well-nourished. No distress.  HENT:  Head: Normocephalic and atraumatic.  Eyes: Pupils are equal, round, and reactive to light. EOM are normal. Right eye exhibits no discharge. Left eye exhibits no discharge.  Neck: Normal range of motion. Neck supple.  Cardiovascular: Normal rate, regular rhythm, normal heart sounds and intact distal pulses.  Pulmonary/Chest: Effort normal and breath sounds normal. No respiratory distress.  Respirations equal and unlabored, patient able to speak in full sentences, lungs clear to auscultation bilaterally  Abdominal: Soft. Bowel sounds are normal. She exhibits no distension and no mass. There is no tenderness. There is no guarding.  Musculoskeletal: She exhibits no edema or deformity.  Mild midline low back tenderness, with tenderness much more pronounced over the left lower back musculature, positive straight leg rise on the left side, negative on the right.  Neurological: She is alert and oriented to person, place, and time. Coordination normal.  Speech is clear, able to follow commands CN III-XII intact Normal strength in upper and lower extremities bilaterally including  dorsiflexion and plantar flexion, strong and equal grip strength 2+ DTRs in bilateral lower extremities Sensation normal to light and sharp touch Moves extremities without ataxia, coordination intact  Skin: Skin is warm and dry. Capillary refill takes less than 2 seconds. She is not diaphoretic.  Psychiatric: She has a normal mood and affect. Her behavior is normal.  Nursing note and vitals reviewed.    ED Treatments / Results  Labs (all labs ordered are listed, but only abnormal results are displayed) Labs Reviewed  PREGNANCY, URINE  URINALYSIS, ROUTINE W REFLEX MICROSCOPIC    EKG None  Radiology Dg Lumbar Spine Complete  Result Date: 06/02/2017 CLINICAL DATA:  Acute onset low back and  left hip pain today. Initial encounter. EXAM: LUMBAR SPINE - COMPLETE 4+ VIEW COMPARISON:  CT abdomen and pelvis 06/19/2014. FINDINGS: There is no evidence of lumbar spine fracture. Alignment is normal. Mild convex right curvature noted. Intervertebral disc spaces are maintained. IMPRESSION: Mild convex right scoliosis.  The exam is otherwise negative. Electronically Signed   By: Drusilla Kanner M.D.   On: 06/02/2017 14:32    Procedures Procedures (including critical care time)  Medications Ordered in ED Medications  ketorolac (TORADOL) 30 MG/ML injection 30 mg (30 mg Intramuscular Given 06/02/17 1456)  dexamethasone (DECADRON) injection 10 mg (10 mg Intramuscular Given 06/02/17 1456)  methocarbamol (ROBAXIN) tablet 1,000 mg (1,000 mg Oral Given 06/02/17 1456)     Initial Impression / Assessment and Plan / ED Course  I have reviewed the triage vital signs and the nursing notes.  Pertinent labs & imaging results that were available during my care of the patient were reviewed by me and considered in my medical decision making (see chart for details).  Normal neurological exam, no evidence of urinary incontinence or retention, pain is consistently reproducible. There is no evidence of AAA or  concern for dissection at this time.   Patient can walk but states is painful.  No loss of bowel or bladder control.  No concern for cauda equina.  No fever, night sweats, weight loss, h/o cancer, IVDU.  Negative urine pregnancy and urine without signs of infection, lumbar x-ray shows very mild scoliosis but no other acute changes.  Pain treated here in the department with adequate improvement. RICE protocol and pain medicine indicated and discussed with patient. I have also discussed reasons to return immediately to the ER.  Patient expresses understanding and agrees with plan.   Final Clinical Impressions(s) / ED Diagnoses   Final diagnoses:  Acute left-sided low back pain with left-sided sciatica    ED Discharge Orders        Ordered    methylPREDNISolone (MEDROL DOSEPAK) 4 MG TBPK tablet     06/02/17 1450    methocarbamol (ROBAXIN) 500 MG tablet  2 times daily     06/02/17 1450       Legrand Rams 06/02/17 2102    Gerhard Munch, MD 06/04/17 2158

## 2017-06-02 NOTE — Discharge Instructions (Signed)
Your symptoms are likely due to inflammation of the sciatic nerve, x-ray shows mild scoliosis but overall no other changes, no evidence of urinary tract infection.  Please take short course of steroids as directed, you may use muscle relaxers as needed as well, this medication can make you drowsy do not combine with alcohol and do not take before driving or operating machinery.  Take it easy and rest over the next 2 to 3 days, avoid heavy lifting or forward bending.  Please follow-up with your primary care doctor, if back pain is not improving with these conservative measures she can also follow-up with Dr. Charlann Boxer with orthopedics.  Please return to the emergency department if you have any loss of bowel or bladder control, numbness, or weakness in both legs, difficulty walking, fevers, abdominal pain or urinary symptoms or any other new or concerning symptoms.

## 2017-06-02 NOTE — ED Triage Notes (Signed)
Pt states she had sudden onset of left side back pain that radiates down her left leg. Pt reports she had an episode of numbness in her big toe this morning. Pt denies any bowel or bladder incontinence.

## 2018-10-12 ENCOUNTER — Encounter: Payer: Self-pay | Admitting: Gynecology

## 2020-08-17 ENCOUNTER — Ambulatory Visit: Payer: Managed Care, Other (non HMO) | Admitting: Nurse Practitioner

## 2020-10-16 ENCOUNTER — Encounter (HOSPITAL_BASED_OUTPATIENT_CLINIC_OR_DEPARTMENT_OTHER): Payer: Self-pay | Admitting: Emergency Medicine

## 2020-10-16 ENCOUNTER — Emergency Department (HOSPITAL_BASED_OUTPATIENT_CLINIC_OR_DEPARTMENT_OTHER)
Admission: EM | Admit: 2020-10-16 | Discharge: 2020-10-16 | Disposition: A | Discharge status: Home / Self Care | Payer: Self-pay | Attending: Emergency Medicine | Admitting: Emergency Medicine

## 2020-10-16 ENCOUNTER — Emergency Department (HOSPITAL_BASED_OUTPATIENT_CLINIC_OR_DEPARTMENT_OTHER): Payer: Self-pay

## 2020-10-16 ENCOUNTER — Other Ambulatory Visit: Payer: Self-pay

## 2020-10-16 DIAGNOSIS — R200 Anesthesia of skin: Secondary | ICD-10-CM | POA: Insufficient documentation

## 2020-10-16 DIAGNOSIS — N9489 Other specified conditions associated with female genital organs and menstrual cycle: Secondary | ICD-10-CM | POA: Insufficient documentation

## 2020-10-16 DIAGNOSIS — M545 Low back pain, unspecified: Secondary | ICD-10-CM | POA: Insufficient documentation

## 2020-10-16 DIAGNOSIS — F17210 Nicotine dependence, cigarettes, uncomplicated: Secondary | ICD-10-CM | POA: Insufficient documentation

## 2020-10-16 DIAGNOSIS — M25552 Pain in left hip: Secondary | ICD-10-CM | POA: Insufficient documentation

## 2020-10-16 MED ORDER — METHOCARBAMOL 500 MG PO TABS: 500.0000 mg | ORAL_TABLET | Freq: Two times a day (BID) | ORAL | 0 refills | Status: AC

## 2020-10-16 MED ORDER — KETOROLAC TROMETHAMINE 30 MG/ML IJ SOLN
30.0000 mg | Freq: Once | INTRAMUSCULAR | Status: AC
  Administered 2020-10-16: 30 mg via INTRAMUSCULAR

## 2020-10-16 MED ORDER — LIDOCAINE 5 % EX PTCH: 1.0000 | MEDICATED_PATCH | CUTANEOUS | 0 refills | Status: AC

## 2020-10-16 MED ORDER — METHOCARBAMOL 500 MG PO TABS
500.0000 mg | ORAL_TABLET | Freq: Once | ORAL | Status: AC
  Administered 2020-10-16: 500 mg via ORAL
  Filled 2020-10-16: qty 1

## 2020-10-16 MED ORDER — OXYCODONE-ACETAMINOPHEN 5-325 MG PO TABS
1.0000 | ORAL_TABLET | Freq: Once | ORAL | Status: AC
  Administered 2020-10-16: 1 via ORAL
  Filled 2020-10-16: qty 1

## 2020-10-16 MED ORDER — LIDOCAINE 5 % EX PTCH
1.0000 | MEDICATED_PATCH | CUTANEOUS | Status: DC
  Administered 2020-10-16: 1 via TRANSDERMAL
  Filled 2020-10-16: qty 1

## 2020-10-16 MED ORDER — KETOROLAC TROMETHAMINE 30 MG/ML IJ SOLN
30.0000 mg | Freq: Once | INTRAMUSCULAR | Status: DC
  Filled 2020-10-16: qty 1

## 2020-10-16 NOTE — Discharge Instructions (Addendum)
Please use Tylenol or ibuprofen for pain.  You may use 600 mg ibuprofen every 6 hours or 1000 mg of Tylenol every 6 hours.  You may choose to alternate between the 2.  This would be most effective.  Not to exceed 4 g of Tylenol within 24 hours.  Not to exceed 3200 mg ibuprofen 24 hours. You can also use Naproxen/Aleve in place of the Ibuprofen at 500 mg q12h.  You were also given a prescription for Lidoderm patch which you can apply over the location where it hurts.   You were given a prescription for Robaxin which is a muscle relaxer.  You should not drive, work, consume alcohol, or operate machinery while taking this medication as it can make you very drowsy.    I have provided information for you to get established with [facility] as your PCP. Please schedule an appointment with them. I have also provided you with information to an orthopedic clinic that you can call if your symptoms are not adequately controlled by the interventions provided.   Please return to this department if you develop urinary or bowel incontinence, urinary retention, numbness to your groin area, weakness to your legs, or fever/chills.

## 2020-10-16 NOTE — ED Triage Notes (Signed)
Pt arrives to ED with c/o of lower back pain. The back pain came on suddenly when getting out of her car. Pt reports that the pain is sharp and constant. Aleve did not resolve the pain. No bowel or bladder issues. No numbness or tingling in legs.

## 2020-10-16 NOTE — ED Notes (Signed)
Pt reports that she was walking into work and her back began hurting; became so bad she could not walk. Pt reports pain at "top of butt cheeks" and is numb in the middle of her back. Pt reports pain is worse on left than right. Pt alert & oriented, nad noted.

## 2020-10-16 NOTE — ED Notes (Signed)
This RN presented the AVS utilizing Teachback Method. Patient verbalizes understanding of Discharge Instructions. Opportunity for Questioning and Answers were provided. Patient Discharged from ED in Wheelchair to Home with Family member.

## 2020-10-16 NOTE — ED Provider Notes (Signed)
[facility] EMERGENCY DEPT Provider Note   CSN: [id number] Arrival date & time: 10/16/20  1613     History Chief Complaint  Patient presents with   Back Pain    Jacqueline Stewart is a 35 y.o. female.  Patient with no notable past medical history.  She presents today with complaints of lower back pain.  She says that the pain started earlier today.  She had been getting up out of her car and walking towards work when she had sudden onset of lower back pain.  He says the pain has been constant.  She describes it as dull, sharp, shooting pains.  The pain has gradually worsened throughout the day.  She has taken Aleve and Tylenol which did not resolve the pain.  She also says that she has numbness across the center of her lower back.  She was able to walk initially however throughout the day she has been unable to walk without bending over and crippling pain.  Denies any saddle anesthesia, numbness or tingling in her legs, sheeting pains down her legs, bowel or bladder incontinence, urinary retention, fever, chills, history of IV drug use.  She denies any chance of being pregnant due to not being sexually active.   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   C&B LGSIL   Migraine    Uterine polyp     Patient Active Problem List   Diagnosis Date Noted   Migraine    CIN II (cervical intraepithelial neoplasia II)     Past Surgical History:  Procedure Laterality Date   CESAREAN SECTION     COLPOSCOPY       OB History     Gravida  2   Para  2   Term  2   Preterm      AB      Living  2      SAB      IAB       Ectopic      Multiple      Live Births              Family History  Problem Relation Age of Onset   Thyroid disease Mother    Diabetes Maternal Uncle    Diabetes Maternal Grandmother    Hypertension Maternal Grandmother    Heart disease Maternal Grandmother    Heart disease Paternal Grandfather    Cancer Paternal Grandfather        prostate    Social History   Tobacco Use   Smoking status: Every Day    Packs/day: 0.50    Types: Cigarettes   Smokeless tobacco: Never  Vaping Use   Vaping Use: Some days  Substance Use Topics   Alcohol use: Yes    Comment: rare   Drug use: No    Home Medications Prior to Admission medications   Medication Sig Start Date End Date Taking? Authorizing Provider  lidocaine (LIDODERM) 5 % Place 1 patch onto the skin daily. Remove & Discard patch within 12 hours or as directed by MD 10/16/20  Yes Desirie Minteer, Finis Bud, PA-C  methocarbamol (ROBAXIN)  500 MG tablet Take 1 tablet (500 mg total) by mouth 2 (two) times daily. 10/16/20  Yes Kamron Portee, Finis Bud, PA-C  citalopram (CELEXA) 20 MG tablet Take 1 tablet by mouth daily. 04/27/17   [provider]  clonazePAM (KLONOPIN) 1 MG tablet Take 1 mg by mouth at bedtime.  04/27/17   [provider]  diphenoxylate-atropine (LOMOTIL) 2.5-0.025 MG tablet Take 1 tablet by mouth 4 (four) times daily as needed for diarrhea or loose stools. 05/20/17   Rolland Porter, MD  methylPREDNISolone (MEDROL DOSEPAK) 4 MG TBPK tablet Take as directed 06/02/17   Dartha Lodge, PA-C  Rizatriptan Benzoate (MAXALT PO) Take 1 tablet by mouth as needed (migraines).     [provider]    Allergies    Doxycycline  Review of Systems   Review of Systems  Constitutional:  Negative for chills and fever.  HENT:  Negative for congestion and rhinorrhea.   Eyes:  Negative for visual disturbance.  Respiratory:  Negative for cough, chest tightness and shortness of breath.   Cardiovascular:  Negative for chest pain,  palpitations and leg swelling.  Gastrointestinal:  Negative for abdominal pain, constipation, diarrhea, nausea and vomiting.  Genitourinary:  Negative for difficulty urinating and dysuria.  Musculoskeletal:  Positive for back pain.  Skin:  Negative for rash and wound.  Neurological:  Positive for numbness. Negative for dizziness, syncope, weakness, light-headedness and headaches.  All other systems reviewed and are negative.  Physical Exam Updated Vital Signs BP 126/88 (BP Location: Right Arm)   Pulse 88   Temp 98.1 F (36.7 C) (Temporal)   Resp 16   Ht 5\' 4"  (1.626 m)   Wt 65.8 kg   SpO2 100%   BMI 24.89 kg/m   Physical Exam Vitals and nursing note reviewed.  Constitutional:      General: She is in acute distress.     Appearance: Normal appearance. She is not ill-appearing, toxic-appearing or diaphoretic.     Comments: To be in acute distress due to significant pain.  Patient is hunched over and appears to be very uncomfortable.  HENT:     Head: Normocephalic and atraumatic.  Eyes:     General: No visual field deficit or scleral icterus.       Right eye: No discharge.        Left eye: No discharge.     Conjunctiva/sclera: Conjunctivae normal.  Cardiovascular:     Rate and Rhythm: Normal rate and regular rhythm.     Pulses: Normal pulses.     Heart sounds: Normal heart sounds, S1 normal and S2 normal. No murmur heard.   No friction rub. No gallop.  Pulmonary:     Effort: Pulmonary effort is normal. No respiratory distress.     Breath sounds: Normal breath sounds. No wheezing, rhonchi or rales.  Abdominal:     General: Abdomen is flat. Bowel sounds are normal. There is no distension.     Palpations: Abdomen is soft. There is no pulsatile mass.     Tenderness: There is no abdominal tenderness. There is no guarding or rebound.  Musculoskeletal:     Lumbar back: Tenderness present. No swelling, deformity or lacerations.     Right lower leg: No edema.     Left lower leg:  No edema.     Comments: Patient has significant bilateral paraspinal muscle tenderness to the lumbar spine.  Is more prominent on the left side.  She also has  tenderness to the left hip.  She does have some midline tenderness to palpation of lumbar spine and sacral area.  No sensation deficit to lateral, dorsal, medial aspect of bilateral feet.  Distal pedal pulses are 2+ bilaterally.  No swelling of lower extremities bilaterally.  Unable to do straight leg raise due to patient not being able to lie flat.  Skin:    General: Skin is warm and dry.     Coloration: Skin is not jaundiced.     Findings: No bruising, erythema, lesion or rash.  Neurological:     General: No focal deficit present.     Mental Status: She is alert and oriented to person, place, and time.     GCS: GCS eye subscore is 4. GCS verbal subscore is 5. GCS motor subscore is 6.     Cranial Nerves: Cranial nerves are intact. No cranial nerve deficit, dysarthria or facial asymmetry.     Motor: Motor function is intact.  Psychiatric:        Mood and Affect: Mood normal.        Behavior: Behavior normal.    ED Results / Procedures / Treatments   Labs (all labs ordered are listed, but only abnormal results are displayed) Labs Reviewed - No data to display  EKG None  Radiology CT Renal Stone Study  Result Date: 10/16/2020 CLINICAL DATA:  Low back pain.  Left lower quadrant pain. EXAM: CT ABDOMEN AND PELVIS WITHOUT CONTRAST TECHNIQUE: Multidetector CT imaging of the abdomen and pelvis was performed following the standard protocol without IV contrast. COMPARISON:  06/19/2014 FINDINGS: Lower chest: Clear lung bases. Normal heart size without pericardial or pleural effusion. Hepatobiliary: Normal liver. Normal gallbladder, without biliary ductal dilatation. Pancreas: Normal, without mass or ductal dilatation. Spleen: Normal in size, without focal abnormality. Adrenals/Urinary Tract: Normal adrenal glands. No renal calculi or  hydronephrosis. No hydroureter or ureteric calculi. No bladder calculi. Stomach/Bowel: Normal stomach, without wall thickening. Normal colon and terminal ileum. The appendix is at least partially identified on 56/2, without periappendiceal inflammation. Normal small bowel. Vascular/Lymphatic: Normal caliber of the aorta and branch vessels. No abdominopelvic adenopathy. Reproductive: Normal uterus and adnexa. Calcification in the region of the the vaginal introitus is likely dystrophic including on 80/2, similar to 2016. Other: Trace free pelvic fluid is likely physiologic. No free intraperitoneal air. Musculoskeletal: No acute osseous abnormality. Mild S shaped thoracolumbar spine curvature. IMPRESSION: No urinary tract calculi or hydronephrosis. Electronically Signed   By: Jeronimo Greaves M.D.   On: 10/16/2020 19:45    Procedures Procedures   Medications Ordered in ED Medications  lidocaine (LIDODERM) 5 % 1 patch (1 patch Transdermal Patch Applied 10/16/20 2013)  methocarbamol (ROBAXIN) tablet 500 mg (500 mg Oral Given 10/16/20 1757)  ketorolac (TORADOL) 30 MG/ML injection 30 mg (30 mg Intramuscular Given 10/16/20 1754)  oxyCODONE-acetaminophen (PERCOCET/ROXICET) 5-325 MG per tablet 1 tablet (1 tablet Oral Given 10/16/20 2015)    ED Course  I have reviewed the triage vital signs and the nursing notes.  Pertinent labs & imaging results that were available during my care of the patient were reviewed by me and considered in my medical decision making (see chart for details).    MDM Rules/Calculators/A&P                         This is a well-appearing 35 year old female patient to the emergency department with acute onset lower back pain started early this afternoon.  Does appear to be in acute distress  due to pain.  Her vitals are overall reassuring.  She has no red flag symptoms for lower back pain.  I am unsure why the patient is having numbness of her lower back.  I spoke with Dr. Bernette Mayers and he  agrees that she does not need spine imaging at this time.  We will attempt to treat patient's symptoms with IV Toradol and Robaxin.  Will reevaluate after.  On reevaluation, pain is still very much present.  She says that it is more localized to the left side of her back at this point.  Obtain CT stone study just to rule out kidney stone.  CT with no evidence of kidney stone.  Also evaluated the lumbar spine with no evidence of fracture on that image.  I discussed these results with the patient and the appropriate treatment at this time.  Patient needs to treat pain symptomatically.  She is advised to alternate Tylenol and ibuprofen around-the-clock for the next 2 weeks.  She is also given a prescription for Robaxin for muscle relaxation.  She is also given a Lidoderm patch to assist with the pain.  The patient does not have a PCP, will refer to physical therapy.  She needs to get established with a PCP so have provided information for [facility] community health and wellness clinic.  Also provided information for [facility] if her pain continues to be a problem.  Provided patient with 1 dose of oxycodone and Lidoderm patch prior to leaving the emergency department.  Final Clinical Impression(s) / ED Diagnoses Final diagnoses:  Acute left-sided low back pain without sciatica    Rx / DC Orders ED Discharge Orders          Ordered    methocarbamol (ROBAXIN) 500 MG tablet  2 times daily        10/16/20 2008    lidocaine (LIDODERM) 5 %  Every 24 hours        10/16/20 2008    Ambulatory referral to Physical Therapy        10/16/20 2008             Therese Sarah 10/16/20 2225    Pollyann Savoy, MD 10/16/20 2325

## 2021-01-09 ENCOUNTER — Encounter: Payer: Self-pay | Admitting: Internal Medicine

## 2021-01-10 ENCOUNTER — Encounter: Payer: Self-pay | Admitting: *Deleted

## 2021-02-21 ENCOUNTER — Ambulatory Visit (INDEPENDENT_AMBULATORY_CARE_PROVIDER_SITE_OTHER): Payer: Managed Care, Other (non HMO) | Admitting: Internal Medicine

## 2021-02-21 ENCOUNTER — Encounter: Payer: Self-pay | Admitting: Internal Medicine

## 2021-02-21 VITALS — BP 112/74 | HR 82 | Resp 16 | Ht 63.0 in | Wt 130.4 lb

## 2021-02-21 DIAGNOSIS — K5909 Other constipation: Secondary | ICD-10-CM

## 2021-02-21 DIAGNOSIS — R194 Change in bowel habit: Secondary | ICD-10-CM | POA: Diagnosis not present

## 2021-02-21 DIAGNOSIS — R140 Abdominal distension (gaseous): Secondary | ICD-10-CM

## 2021-02-21 DIAGNOSIS — R6881 Early satiety: Secondary | ICD-10-CM

## 2021-02-21 DIAGNOSIS — K921 Melena: Secondary | ICD-10-CM

## 2021-02-21 DIAGNOSIS — R109 Unspecified abdominal pain: Secondary | ICD-10-CM | POA: Diagnosis not present

## 2021-02-21 MED ORDER — DICYCLOMINE HCL 20 MG PO TABS: 20.0000 mg | ORAL_TABLET | Freq: Three times a day (TID) | ORAL | 2 refills | Status: DC

## 2021-02-21 MED ORDER — NA SULFATE-K SULFATE-MG SULF 17.5-3.13-1.6 GM/177ML PO SOLN: 1.0000 | ORAL | 0 refills | Status: DC

## 2021-02-21 NOTE — Patient Instructions (Signed)
You have been scheduled for an endoscopy and colonoscopy. Please follow the written instructions given to you at your visit today. Please pick up your prep supplies at the pharmacy within the next 1-3 days. If you use inhalers (even only as needed), please bring them with you on the day of your procedure.  We have sent the following medications to your pharmacy for you to pick up at your convenience: Suprep , Bentyl   If you are age 37 or younger, your body mass index should be between 19-25. Your Body mass index is 23.1 kg/m. If this is out of the aformentioned range listed, please consider follow up with your Primary Care Provider.   ________________________________________________________  The [facility] providers would like to encourage you to use [facility] to communicate with providers for non-urgent requests or questions.  Due to long hold times on the telephone, sending your provider a message by [facility] may be a faster and more efficient way to get a response.  Please allow 48 business hours for a response.  Please remember that this is for non-urgent requests.  _______________________________________________________  Thank you for choosing me and [facility].  Dr.Jay Pyrtle

## 2021-02-22 ENCOUNTER — Encounter: Payer: Self-pay | Admitting: Internal Medicine

## 2021-02-22 NOTE — Progress Notes (Signed)
Patient ID: Cipriano MileKristian J Kennerly, female   DOB: 09/21/1985, 36 y.o.   MRN: [id number] HPI: Petra KubaKristian Kasinger is a 36 year old female with a history of SIBO, chronic constipation, prior cervical dysplasia, migraine and anxiety who is seen in consult at the request of Dr. Tiburcio PeaHarris to evaluate change in bowel habits and intermittent rectal bleeding along with right-sided abdominal pain.  She is here alone today.  She reports that over the last 5 to 6 months she has developed change in bowel habit along with a right-sided abdominal pain.  She reports this is in the mid axillary line is a jabbing sharp though constant pain when it is present.  Seems to be worse with eating.  Typically happens 30 minutes to 1 hours after eating.  Appetite has been decreased with some early fullness and nausea.  No vomiting.  This pain does come and go but happens 5 or 6 days a week and can last hours.  She uses a heating pad with some success.  No really alleviating factors other than lying down and trying to sleep.  She was prescribed methocarbamol and pantoprazole by primary or urgent care.  The pantoprazole did not really seem to help so she stopped it.  The methocarbamol did help but she ran out of it.  Bowel habits have also changed she has noticed mucus in her stools as well as blood with wiping.  She tends to be constipated over her whole life for which she was using magnesium which helps.  She stopped the magnesium but still having multiple small and incomplete bowel movements typically daily.  She can at times skip a day without bowel movement.  She has noticed increased abdominal gas and burping.  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     citalopram (CELEXA) 20 MG tablet Take 1 tablet by mouth daily. (Patient not taking: Reported on 02/21/2021)  4   clonazePAM (KLONOPIN) 1 MG tablet Take 1 mg by mouth at bedtime.  (Patient not taking: Reported on 02/21/2021)  4   diphenoxylate-atropine (LOMOTIL) 2.5-0.025 MG tablet Take 1 tablet by mouth 4 (four) times daily as needed for diarrhea or loose stools. (Patient not taking: Reported on 02/21/2021) 30 tablet 0   lidocaine (LIDODERM) 5 % Place 1 patch onto the skin daily. Remove & Discard patch within 12 hours or as directed by MD (Patient not taking: Reported on 02/21/2021) 30 patch 0   methocarbamol (ROBAXIN) 500 MG tablet Take 1 tablet (500 mg total) by mouth 2 (two) times daily. (Patient not taking: Reported on 02/21/2021) 20 tablet 0   methylPREDNISolone (MEDROL DOSEPAK) 4 MG TBPK tablet Take as directed (Patient not taking: Reported on 02/21/2021) 21 tablet 0   Rizatriptan Benzoate (MAXALT PO) Take 1 tablet by mouth as needed (migraines).  (Patient not taking: Reported on 02/21/2021)     No facility-administered medications prior to visit.    Allergies  Allergen Reactions  Doxycycline Nausea Only and Other (See Comments)    Blisters on skin (Exsposure to minimal amount of sunlight)    Family History  Problem Relation Age of Onset   Diabetes Mother    Stroke Mother    Cirrhosis Mother    Thyroid disease Mother    Diabetes Father    Diabetes Maternal Uncle    Diabetes Maternal Grandmother    Hypertension Maternal Grandmother    Heart disease Maternal Grandmother    Heart disease Paternal Grandfather    Cancer Paternal Grandfather        prostate    Social History   Tobacco Use   Smoking status: Every Day    Packs/day: 0.25    Types: Cigarettes   Smokeless tobacco:  Never  Vaping Use   Vaping Use: Some days  Substance Use Topics   Alcohol use: Yes    Comment: rare   Drug use: No    Comment: + for cannabinoids 01/08/21    ROS: As per history of present illness, otherwise negative  BP 112/74 (BP Location: Left Arm, Patient Position: Sitting, Cuff Size: Normal)    Pulse 82    Resp 16    Ht 5\' 3"  (1.6 m)    Wt 130 lb 6.4 oz (59.1 kg)    SpO2 98%    BMI 23.10 kg/m  Constitutional: Well-developed and well-nourished. No distress. HEENT: Normocephalic and atraumatic. Oropharynx is clear and moist.  No scleral icterus. Cardiovascular: Normal rate, regular rhythm and intact distal pulses. No M/R/G Pulmonary/chest: Effort normal and breath sounds normal. No wheezing, rales or rhonchi. Abdominal: Soft, nontender, nondistended. Bowel sounds active throughout. There are no masses palpable. No hepatosplenomegaly. Extremities: no clubbing, cyanosis, or edema Neurological: Alert and oriented to person place and time. Skin: Skin is warm and dry.  Psychiatric: Normal mood and affect. Behavior is normal.  RELEVANT LABS AND IMAGING: SITZ marker study: FINDINGS:  Nonobstructive bowel gas pattern. Moderate amount of colonic stool. The bones are unremarkable for age.    There are 16 sitz markers in the ascending colon to mid transverse  colon.   There are 2 sitz markers in the distal transverse colon and descending colon.   There are 8 sitz markers in the region of the sigmoid and rectum.    ANORECTAL manometry                                                                               Impression:        - Resting study reveals a normal internal anal sphincter                     pressure.                     - Squeeze study reveals a normal external anal sphincter                     pressure.                     - RAIR (Rectoanal Inhibitory Reflex) is present suggesting  absence of Hirschsprung's disease.                     - Sensation  study reveals a normal first sensation                     threshold with an elevated urge threshold and normal                     maximal tolerated volume threshold.                     - Able to expel defecation balloon.                     - Elevated pelvic floor resting activity on EMG.                     - Low maximal squeeze activity on EMG.                     - Strain manuever reveals slight decrease in pelvic floor                     activity in the middle of strain with paradoxical increase                     at the beginning and end of strain. This, in conjunction                     with successfull balloon explusion, makes clinically                     significant dyssenergia unlikely.   CT ABDOMEN AND PELVIS WITHOUT CONTRAST   TECHNIQUE: Multidetector CT imaging of the abdomen and pelvis was performed following the standard protocol without IV contrast.   COMPARISON:  06/19/2014   FINDINGS: Lower chest: Clear lung bases. Normal heart size without pericardial or pleural effusion.   Hepatobiliary: Normal liver. Normal gallbladder, without biliary ductal dilatation.   Pancreas: Normal, without mass or ductal dilatation.   Spleen: Normal in size, without focal abnormality.   Adrenals/Urinary Tract: Normal adrenal glands. No renal calculi or hydronephrosis. No hydroureter or ureteric calculi. No bladder calculi.   Stomach/Bowel: Normal stomach, without wall thickening. Normal colon and terminal ileum. The appendix is at least partially identified on 56/2, without periappendiceal inflammation. Normal small bowel.   Vascular/Lymphatic: Normal caliber of the aorta and branch vessels. No abdominopelvic adenopathy.   Reproductive: Normal uterus and adnexa. Calcification in the region of the the vaginal introitus is likely dystrophic including on 80/2, similar to 2016.   Other: Trace free pelvic fluid is likely physiologic. No free intraperitoneal air.    Musculoskeletal: No acute osseous abnormality. Mild S shaped thoracolumbar spine curvature.   IMPRESSION: No urinary tract calculi or hydronephrosis.     Electronically Signed   By: Jeronimo Greaves M.D.   On: 10/16/2020 19:45   ASSESSMENT/PLAN: 36 year old female with a history of SIBO, chronic constipation, prior cervical dysplasia, migraine and anxiety who is seen in consult at the request of Dr. Tiburcio Pea to evaluate change in bowel habits and intermittent rectal bleeding along with right-sided abdominal pain.  Right-sided abdominal pain/bloating/gas/change in bowel habits with mucus and blood in stool --certainly a large part of her symptoms may be secondary to SIBO but I explained that blood with wiping and  in stool is not consistent with bacterial overgrowth.  For this reason I recommended that we evaluate endoscopically.  We will treat pain with antispasmodic until further diagnostic evaluation.  If upper endoscopy and colonoscopy unrevealing then we will retreat for SIBO.  She has been objectively positive for SIBO when tested by breath test in 2018. --Upper endoscopy and colonoscopy in the [facility]; we reviewed the risk, benefits and alternatives and she is agreeable and wishes to proceed --Dicyclomine 20 mg 3 times daily as needed --SIBO therapy if the above testing unrevealing without other diagnosis  2.  Chronic constipation --not an issue now related to #1.  If in fact she is treated for SIBO and symptoms abate her chronic constipation may return.  We can discuss further therapy for this in the future.  3.  CRC screening --would normally start at age 88 which we discussed today.  However we are proceeding with colonoscopy as a #1      LT:JQZESP, Chrissie Noa, Md [address] [place],  [place] [address]

## 2021-03-22 ENCOUNTER — Ambulatory Visit (AMBULATORY_SURGERY_CENTER): Payer: Managed Care, Other (non HMO) | Admitting: Internal Medicine

## 2021-03-22 ENCOUNTER — Other Ambulatory Visit (INDEPENDENT_AMBULATORY_CARE_PROVIDER_SITE_OTHER): Payer: Managed Care, Other (non HMO)

## 2021-03-22 ENCOUNTER — Encounter: Payer: Self-pay | Admitting: Internal Medicine

## 2021-03-22 VITALS — BP 120/79 | HR 80 | Temp 98.3°F | Resp 14 | Ht 64.0 in | Wt 130.0 lb

## 2021-03-22 DIAGNOSIS — K625 Hemorrhage of anus and rectum: Secondary | ICD-10-CM | POA: Diagnosis not present

## 2021-03-22 DIAGNOSIS — R1084 Generalized abdominal pain: Secondary | ICD-10-CM | POA: Diagnosis not present

## 2021-03-22 DIAGNOSIS — R194 Change in bowel habit: Secondary | ICD-10-CM

## 2021-03-22 DIAGNOSIS — K648 Other hemorrhoids: Secondary | ICD-10-CM | POA: Diagnosis not present

## 2021-03-22 DIAGNOSIS — K219 Gastro-esophageal reflux disease without esophagitis: Secondary | ICD-10-CM | POA: Diagnosis not present

## 2021-03-22 DIAGNOSIS — K5730 Diverticulosis of large intestine without perforation or abscess without bleeding: Secondary | ICD-10-CM | POA: Diagnosis not present

## 2021-03-22 DIAGNOSIS — R140 Abdominal distension (gaseous): Secondary | ICD-10-CM | POA: Diagnosis not present

## 2021-03-22 LAB — HCG, QUANTITATIVE, PREGNANCY: Quantitative HCG: <0.6 m[IU]/mL

## 2021-03-22 MED ORDER — SODIUM CHLORIDE 0.9 % IV SOLN: 500.0000 mL | Freq: Once | INTRAVENOUS | Status: DC

## 2021-03-22 NOTE — Progress Notes (Signed)
Pregnancy test is negative. Will proceed with procedure.  ?

## 2021-03-22 NOTE — Progress Notes (Signed)
Called to room to assist during endoscopic procedure.  Patient ID and intended procedure confirmed with present staff. Received instructions for my participation in the procedure from the performing physician.  

## 2021-03-22 NOTE — Progress Notes (Signed)
Pt has irregular periods and hasn't had one this month. Due to her age we will obtain a Serum HCG level per Policy. Dr. Rhea Belton and Gilmore Laroche CRNA made aware. Pt sent to the lab for stat blood draw. ?

## 2021-03-22 NOTE — Progress Notes (Signed)
? ?GASTROENTEROLOGY PROCEDURE H&P NOTE  ? ?Primary Care Physician: ?Johny Blamer, MD ? ? ? ?Reason for Procedure:  Abdominal pain, abdominal bloating, change in bowel habits and rectal bleeding ? ?Plan:    EGD and colonoscopy ? ?Patient is appropriate for endoscopic procedure(s) in the ambulatory ([facility]) setting. ? ?The nature of the procedure, as well as the risks, benefits, and alternatives were carefully and thoroughly reviewed with the patient. Ample time for discussion and questions allowed. The patient understood, was satisfied, and agreed to proceed.  ? ? ? ?HPI: ?Jacqueline Stewart is a 36 y.o. female who presents for EGD and colonoscopy.  Medical history as below.  Tolerated the prep.  No recent chest pain or shortness of breath.  No abdominal pain today. ? ?Past Medical History:  ?Diagnosis Date  ? Anxiety   ? 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 C&B  ? Condyloma acuminata 01/22/2011  ? biopsy removed  ? Constipation   ? 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 C&B LGSIL  ? Migraine   ? Uterine polyp   ? ? ?Past Surgical History:  ?Procedure Laterality Date  ? CESAREAN SECTION    ? COLPOSCOPY    ? ? ?Prior to Admission medications   ?Medication Sig Start Date End Date Taking? Authorizing Provider  ?citalopram (CELEXA) 20 MG tablet Take 1 tablet by mouth daily. ?Patient not taking: Reported on 02/21/2021 04/27/17   [provider]  ?clonazePAM (KLONOPIN) 1 MG tablet Take 1 mg by mouth at bedtime.  ?Patient not taking: Reported on 02/21/2021 04/27/17   [provider]  ?dicyclomine (BENTYL) 20 MG tablet Take 1 tablet (20 mg total) by mouth 4 (four) times daily -  before meals and at bedtime. 02/21/21   Rainier Feuerborn, Carie Caddy, MD  ?diphenhydramine-acetaminophen (TYLENOL PM) 25-500 MG TABS tablet Take 1 tablet by mouth at bedtime as needed.    [provider]  ?diphenoxylate-atropine (LOMOTIL) 2.5-0.025 MG tablet Take 1 tablet by mouth 4 (four)  times daily as needed for diarrhea or loose stools. ?Patient not taking: Reported on 02/21/2021 05/20/17   Rolland Porter, MD  ?lidocaine (LIDODERM) 5 % Place 1 patch onto the skin daily. Remove & Discard patch within 12 hours or as directed by MD ?Patient not taking: Reported on 02/21/2021 10/16/20   Loeffler, Finis Bud, PA-C  ?methocarbamol (ROBAXIN) 500 MG tablet Take 1 tablet (500 mg total) by mouth 2 (two) times daily. ?Patient not taking: Reported on 02/21/2021 10/16/20   Claudie Leach, PA-C  ?pantoprazole (PROTONIX) 20 MG tablet Take 20 mg by mouth daily. 01/08/21   [provider]  ? ? ?Current Outpatient Medications  ?Medication Sig Dispense Refill  ? citalopram (CELEXA) 20 MG tablet Take 1 tablet by mouth daily. (Patient not taking: Reported on 02/21/2021)  4  ? clonazePAM (KLONOPIN) 1 MG tablet Take 1 mg by mouth at bedtime.  (Patient not taking: Reported on 02/21/2021)  4  ? dicyclomine (BENTYL) 20 MG tablet Take 1 tablet (20 mg total) by mouth 4 (four) times daily -  before meals and at bedtime. 60 tablet 2  ? diphenhydramine-acetaminophen (TYLENOL PM) 25-500 MG TABS tablet Take 1 tablet by mouth at bedtime as needed.    ? diphenoxylate-atropine (LOMOTIL) 2.5-0.025 MG tablet Take 1 tablet by mouth 4 (four) times daily as needed for diarrhea or loose stools. (Patient not taking: Reported on 02/21/2021) 30 tablet 0  ? lidocaine (LIDODERM) 5 % Place 1 patch onto  the skin daily. Remove & Discard patch within 12 hours or as directed by MD (Patient not taking: Reported on 02/21/2021) 30 patch 0  ? methocarbamol (ROBAXIN) 500 MG tablet Take 1 tablet (500 mg total) by mouth 2 (two) times daily. (Patient not taking: Reported on 02/21/2021) 20 tablet 0  ? pantoprazole (PROTONIX) 20 MG tablet Take 20 mg by mouth daily.    ? ?Current Facility-Administered Medications  ?Medication Dose Route Frequency Provider Last Rate Last Admin  ? 0.9 %  sodium chloride infusion  500 mL Intravenous Once Disa Riedlinger, Carie Caddy, MD      ? ? ?Allergies  as of 03/22/2021 - Review Complete 03/22/2021  ?Allergen Reaction Noted  ? Doxycycline Nausea Only and Other (See Comments) 07/22/2011  ? ? ?Family History  ?Problem Relation Age of Onset  ? Diabetes Mother   ? Stroke Mother   ? Cirrhosis Mother   ? Thyroid disease Mother   ? Diabetes Father   ? Diabetes Maternal Uncle   ? Diabetes Maternal Grandmother   ? Hypertension Maternal Grandmother   ? Heart disease Maternal Grandmother   ? Heart disease Paternal Grandfather   ? Cancer Paternal Grandfather   ?     prostate  ? ? ?Social History  ? ?Socioeconomic History  ? Marital status: Married  ?  Spouse name: Not on file  ? Number of children: Not on file  ? Years of education: Not on file  ? Highest education level: Not on file  ?Occupational History  ? Not on file  ?Tobacco Use  ? Smoking status: Every Day  ?  Packs/day: 0.25  ?  Types: Cigarettes  ? Smokeless tobacco: Never  ?Vaping Use  ? Vaping Use: Some days  ?Substance and Sexual Activity  ? Alcohol use: Yes  ?  Comment: rare  ? Drug use: No  ?  Comment: + for cannabinoids 01/08/21  ? Sexual activity: Yes  ?  Birth control/protection: None  ?Other Topics Concern  ? Not on file  ?Social History Narrative  ? Not on file  ? ?Social Determinants of Health  ? ?Financial Resource Strain: Not on file  ?Food Insecurity: Not on file  ?Transportation Needs: Not on file  ?Physical Activity: Not on file  ?Stress: Not on file  ?Social Connections: Not on file  ?Intimate Partner Violence: Not on file  ? ? ?Physical Exam: ?Vital signs in last 24 hours: ?@BP  114/73   Pulse 89   Temp 98.3 ?F (36.8 ?C) (Temporal)   Ht 5\' 4"  (1.626 m)   Wt 130 lb (59 kg)   SpO2 100%   BMI 22.31 kg/m?  ?GEN: NAD ?EYE: Sclerae anicteric ?ENT: MMM ?CV: Non-tachycardic ?Pulm: CTA b/l ?GI: Soft, NT/ND ?NEURO:  Alert & Oriented x 3 ? ? ? , MD ?[facility] ? ?03/22/2021 4:11 PM ? ?

## 2021-03-22 NOTE — Op Note (Signed)
[facility] ?Patient Name: Jacqueline Stewart ?Procedure Date: 03/22/2021 2:12 PM ?MRN: [id number] ?Endoscopist: Jerene Bears , MD ?Age: 36 ?Referring MD:  ?Date of Birth: [birth year] ?Gender: Female ?Account #: [id number] ?Procedure:                Colonoscopy ?Indications:              Generalized abdominal pain, Rectal bleeding, Change  ?                          in bowel habits, abdominal bloating ?Medicines:                Monitored Anesthesia Care ?Procedure:                Pre-Anesthesia Assessment: ?                          - Prior to the procedure, a History and Physical  ?                          was performed, and patient medications and  ?                          allergies were reviewed. The patient's tolerance of  ?                          previous anesthesia was also reviewed. The risks  ?                          and benefits of the procedure and the sedation  ?                          options and risks were discussed with the patient.  ?                          All questions were answered, and informed consent  ?                          was obtained. Prior Anticoagulants: The patient has  ?                          taken no previous anticoagulant or antiplatelet  ?                          agents. ASA Grade Assessment: II - A patient with  ?                          mild systemic disease. After reviewing the risks  ?                          and benefits, the patient was deemed in  ?                          satisfactory condition to undergo the procedure. ?  After obtaining informed consent, the colonoscope  ?                          was passed under direct vision. Throughout the  ?                          procedure, the patient's blood pressure, pulse, and  ?                          oxygen saturations were monitored continuously. The  ?                          Olympus [id number]-[id number]) Colonoscope was  ?                          introduced through the  anus and advanced to the  ?                          terminal ileum. The colonoscopy was performed  ?                          without difficulty. The patient tolerated the  ?                          procedure well. The quality of the bowel  ?                          preparation was good. The terminal ileum, ileocecal  ?                          valve, appendiceal orifice, and rectum were  ?                          photographed. ?Scope In: 4:32:21 PM ?Scope Out: 4:45:59 PM ?Scope Withdrawal Time: 0 hours 11 minutes 4 seconds  ?Total Procedure Duration: 0 hours 13 minutes 38 seconds  ?Findings:                 The digital rectal exam was normal. ?                          The terminal ileum appeared normal. ?                          Multiple small-mouthed diverticula were found in  ?                          the sigmoid colon. ?                          Internal hemorrhoids were found during  ?                          retroflexion. The hemorrhoids were small. ?  The exam was otherwise without abnormality. ?Complications:            No immediate complications. ?Estimated Blood Loss:     Estimated blood loss: none. ?Impression:               - The examined portion of the ileum was normal. ?                          - Diverticulosis in the sigmoid colon with  ?                          inflammatory changes. ?                          - Internal hemorrhoids (cause of red blood per  ?                          rectum). ?                          - The examination was otherwise normal. ?                          - No specimens collected. ?Recommendation:           - Patient has a contact number available for  ?                          emergencies. The signs and symptoms of potential  ?                          delayed complications were discussed with the  ?                          patient. Return to normal activities tomorrow.  ?                          Written discharge instructions were  provided to the  ?                          patient. ?                          - Resume previous diet. ?                          - Continue present medications. ?                          - If biopsies from EGD unrevealing then I will  ?                          recommended antibiotics for SIBO. ?                          - Repeat colonoscopy in 10 years for screening  ?  purposes. ?Jerene Bears, MD ?03/22/2021 4:57:39 PM ?This report has been signed electronically. ?

## 2021-03-22 NOTE — Patient Instructions (Signed)
Handout on Diverticulosis, hemorrhoids, and heartburn, hiatal hernia, GERD given. ? ?Await pathology results. ? ?YOU HAD AN ENDOSCOPIC PROCEDURE TODAY AT THE [facility]:   Refer to the procedure report that was given to you for any specific questions about what was found during the examination.  If the procedure report does not answer your questions, please call your gastroenterologist to clarify.  If you requested that your care partner not be given the details of your procedure findings, then the procedure report has been included in a sealed envelope for you to review at your convenience later. ? ?YOU SHOULD EXPECT: Some feelings of bloating in the abdomen. Passage of more gas than usual.  Walking can help get rid of the air that was put into your GI tract during the procedure and reduce the bloating. If you had a lower endoscopy (such as a colonoscopy or flexible sigmoidoscopy) you may notice spotting of blood in your stool or on the toilet paper. If you underwent a bowel prep for your procedure, you may not have a normal bowel movement for a few days. ? ?Please Note:  You might notice some irritation and congestion in your nose or some drainage.  This is from the oxygen used during your procedure.  There is no need for concern and it should clear up in a day or so. ? ?SYMPTOMS TO REPORT IMMEDIATELY: ? ?Following lower endoscopy (colonoscopy or flexible sigmoidoscopy): ? Excessive amounts of blood in the stool ? Significant tenderness or worsening of abdominal pains ? Swelling of the abdomen that is new, acute ? Fever of 100?F or higher ? ?Following upper endoscopy (EGD) ? Vomiting of blood or coffee ground material ? New chest pain or pain under the shoulder blades ? Painful or persistently difficult swallowing ? New shortness of breath ? Fever of 100?F or higher ? Black, tarry-looking stools ? ?For urgent or emergent issues, a gastroenterologist can be reached at any hour by calling [phone]. ?Do not use MyChart messaging for urgent concerns.  ? ? ?DIET:  We do recommend a small meal at first, but then you may proceed to your regular diet.  Drink plenty of fluids but you should avoid alcoholic beverages for 24 hours. ? ?ACTIVITY:  You should plan to take it easy for the rest of today and you should NOT DRIVE or use heavy machinery until tomorrow (because of the sedation medicines used during the test).   ? ?FOLLOW UP: ?Our staff will call the number listed on your records 48-72 hours following your procedure to check on you and address any questions or concerns that you may have regarding the information given to you following your procedure. If we do not reach you, we will leave a message.  We will attempt to reach you two times.  During this call, we will ask if you have developed any symptoms of COVID 19. If you develop any symptoms (ie: fever, flu-like symptoms, shortness of breath, cough etc.) before then, please call [phone].  If you test positive for Covid 19 in the 2 weeks post procedure, please call and report this information to [place].   ? ?If any biopsies were taken you will be contacted by phone or by letter within the next 1-3 weeks.  Please call us at [phone] if you have not heard about the biopsies in 3 weeks.  ? ? ?SIGNATURES/CONFIDENTIALITY: ?You and/or your care partner have signed paperwork which will be entered into your electronic medical record.  These signatures attest to the fact that that the information above on your After Visit Summary has been reviewed and is understood.  Full responsibility of the confidentiality of this discharge information lies with you and/or your care-partner.  ?

## 2021-03-22 NOTE — Progress Notes (Signed)
Pt in recovery with monitors in place, VSS. Report given to receiving RN. Bite guard was placed with pt awake to ensure comfort. No dental or soft tissue damage noted. 

## 2021-03-22 NOTE — Op Note (Signed)
[place] ?Patient Name: Jacqueline Stewart ?Procedure Date: 03/22/2021 2:12 PM ?MRN: [id number] ?Endoscopist: Jerene Bears , MD ?Age: 36 ?Referring MD:  ?Date of Birth: 04/04/1985 ?Gender: Female ?Account #: [id number] ?Procedure:                Upper GI endoscopy ?Indications:              Generalized abdominal pain (right sided > left  ?                          sided), Abdominal bloating, change in bowel habits ?Medicines:                Monitored Anesthesia Care ?Procedure:                Pre-Anesthesia Assessment: ?                          - Prior to the procedure, a History and Physical  ?                          was performed, and patient medications and  ?                          allergies were reviewed. The patient's tolerance of  ?                          previous anesthesia was also reviewed. The risks  ?                          and benefits of the procedure and the sedation  ?                          options and risks were discussed with the patient.  ?                          All questions were answered, and informed consent  ?                          was obtained. Prior Anticoagulants: The patient has  ?                          taken no previous anticoagulant or antiplatelet  ?                          agents. ASA Grade Assessment: II - A patient with  ?                          mild systemic disease. After reviewing the risks  ?                          and benefits, the patient was deemed in  ?                          satisfactory condition to undergo the procedure. ?  After obtaining informed consent, the endoscope was  ?                          passed under direct vision. Throughout the  ?                          procedure, the patient's blood pressure, pulse, and  ?                          oxygen saturations were monitored continuously. The  ?                          GIF [id number] was introduced through the  ?                          mouth, and  advanced to the second part of duodenum.  ?                          The upper GI endoscopy was accomplished without  ?                          difficulty. The patient tolerated the procedure  ?                          well. ?Scope In: ?Scope Out: ?Findings:                 Mild inflammation characterized by friability and  ?                          granularity was found in the lower third of the  ?                          esophagus. Biopsies were taken with a cold forceps  ?                          for histology. ?                          A 1 cm hiatal hernia was present. ?                          The entire examined stomach was normal. Biopsies  ?                          were taken with a cold forceps for histology and  ?                          Helicobacter pylori testing. ?                          The examined duodenum was normal. Biopsies for  ?                          histology were taken with a cold forceps for  ?  evaluation of celiac disease. ?Complications:            No immediate complications. ?Estimated Blood Loss:     Estimated blood loss was minimal. ?Impression:               - Esophageal mucosal changes were present,  ?                          including friability and granularity. Findings are  ?                          suggestive of reflux inflammation. Biopsied. ?                          - 1 cm hiatal hernia. ?                          - Normal stomach. Biopsied. ?                          - Normal examined duodenum. Biopsied. ?Recommendation:           - Patient has a contact number available for  ?                          emergencies. The signs and symptoms of potential  ?                          delayed complications were discussed with the  ?                          patient. Return to normal activities tomorrow.  ?                          Written discharge instructions were provided to the  ?                          patient. ?                           - Resume previous diet. ?                          - Continue present medications. ?                          - Await pathology results. ?                          - See the other procedure note for documentation of  ?                          additional recommendations. ?Jerene Bears, MD ?03/22/2021 4:52:54 PM ?This report has been signed electronically. ?

## 2021-03-26 ENCOUNTER — Telehealth: Payer: Self-pay | Admitting: *Deleted

## 2021-03-26 NOTE — Telephone Encounter (Signed)
?  Follow up Call- ? ?Call back number 03/22/2021  ?Post procedure Call Back phone  # [phone]  ?Permission to leave phone message Yes  ?Some recent data might be hidden  ?  ? ?Patient questions: ? ?Do you have a fever, pain , or abdominal swelling? No. ?Pain Score  0 * ? ?Have you tolerated food without any problems? Yes.   ? ?Have you been able to return to your normal activities? Yes.   ? ?Do you have any questions about your discharge instructions: ?Diet   No. ?Medications  No. ?Follow up visit  No. ? ?Do you have questions or concerns about your Care? No. ? ?Actions: ?* If pain score is 4 or above: ?No action needed, pain <4. ? ? ?

## 2021-03-30 ENCOUNTER — Other Ambulatory Visit: Payer: Self-pay

## 2021-03-30 MED ORDER — RIFAXIMIN 550 MG PO TABS: 550.0000 mg | ORAL_TABLET | Freq: Three times a day (TID) | ORAL | 0 refills | Status: DC

## 2021-12-06 ENCOUNTER — Telehealth: Payer: Self-pay | Admitting: Internal Medicine

## 2021-12-06 MED ORDER — RIFAXIMIN 550 MG PO TABS: 550.0000 mg | ORAL_TABLET | Freq: Three times a day (TID) | ORAL | 0 refills | Status: AC

## 2021-12-06 NOTE — Telephone Encounter (Signed)
Inbound call from patient requesting a call back to discuss if she can get a prescription for Xifaxan. Patient states that she is having the same issues she was having before and Dr. Rhea Belton advised her if it happened again to call the office. Please advise.

## 2021-12-06 NOTE — Telephone Encounter (Signed)
Voicemail left for pt on identified voicemail.

## 2021-12-06 NOTE — Telephone Encounter (Signed)
Script sent to pharmacy, pt aware. 

## 2021-12-06 NOTE — Telephone Encounter (Signed)
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

## 2021-12-06 NOTE — Telephone Encounter (Signed)
Please see note below and advise regarding xifaxan.

## 2021-12-07 ENCOUNTER — Other Ambulatory Visit: Payer: Self-pay

## 2021-12-07 MED ORDER — DICYCLOMINE HCL 20 MG PO TABS: 20.0000 mg | ORAL_TABLET | Freq: Three times a day (TID) | ORAL | 2 refills | Status: AC

## 2021-12-07 NOTE — Telephone Encounter (Signed)
Yes can refill it, same sig

## 2021-12-07 NOTE — Telephone Encounter (Signed)
Patient is calling wanting to know if Dr Rhea Belton would be able to give her another Rx of Dicylomine states it helped her last time and she does not have the funds to come into the office. Please advise.

## 2021-12-07 NOTE — Telephone Encounter (Signed)
Left detailed message for patient that refill had been sent to pharmacy & advised to call back with any further questions.

## 2022-11-15 ENCOUNTER — Ambulatory Visit: Payer: Managed Care, Other (non HMO) | Admitting: Obstetrics and Gynecology

## 2024-01-05 ENCOUNTER — Other Ambulatory Visit: Payer: Self-pay | Admitting: Obstetrics and Gynecology

## 2024-01-06 LAB — SURGICAL PATHOLOGY
